# Patient Record
Sex: Female | Born: 1995 | Hispanic: Yes | Marital: Married | Smoking: Never smoker
Health system: Southern US, Community
[De-identification: ages and names within clinical notes are randomized; demographics above are authoritative.]

## PROBLEM LIST (undated history)

## (undated) HISTORY — PX: BREAST ENHANCEMENT SURGERY: SHX7

## (undated) HISTORY — PX: BREAST SURGERY: SHX581

## (undated) HISTORY — PX: EYE SURGERY: SHX253

---

## 2014-12-27 DIAGNOSIS — Z23 Encounter for immunization: Secondary | ICD-10-CM | POA: Insufficient documentation

## 2014-12-27 DIAGNOSIS — Y9389 Activity, other specified: Secondary | ICD-10-CM | POA: Insufficient documentation

## 2014-12-27 DIAGNOSIS — Y288XXA Contact with other sharp object, undetermined intent, initial encounter: Secondary | ICD-10-CM | POA: Insufficient documentation

## 2014-12-27 DIAGNOSIS — S0101XA Laceration without foreign body of scalp, initial encounter: Secondary | ICD-10-CM

## 2014-12-27 DIAGNOSIS — Y9289 Other specified places as the place of occurrence of the external cause: Secondary | ICD-10-CM | POA: Insufficient documentation

## 2014-12-27 DIAGNOSIS — Y998 Other external cause status: Secondary | ICD-10-CM | POA: Insufficient documentation

## 2014-12-27 NOTE — ED Provider Notes (Signed)
CSN: [id number]     Arrival date & time 12/27/14  1201 History  This chart was scribed for non-physician practitioner, Santiago Glad, PA-C, working with Nelva Nay, MD by Freida Busman, ED Scribe. This patient was seen in room WTR9/WTR9 and the patient's care was started at 12:41 PM.    Chief Complaint  Patient presents with  . Head Laceration   The history is provided by the patient. No language interpreter was used.    HPI Comments:  Leslie Fox is a 19 y.o. female who presents to the Emergency Department complaining of a laceration to the left side of her scalp after being struck  by a ceiling fan a few minutes PTA, with 9/10 surrounding pain. She states she was not paying attention when the fan struck her.  Fan did not fall on her head.  Her tetanus status is unknown . She denies LOC, nausea, vomiting and vision changes. No alleviating factors or associated symptoms noted.   History reviewed. No pertinent past medical history. Past Surgical History  Procedure Laterality Date  . Breast enhancement surgery     No family history on file. Social History  Substance Use Topics  . Smoking status: Never Smoker   . Smokeless tobacco: None  . Alcohol Use: No   OB History    No data available     Review of Systems  Eyes: Negative for visual disturbance.  Gastrointestinal: Negative for nausea and vomiting.  Skin: Positive for wound.  Neurological: Negative for syncope.     Allergies  Review of patient's allergies indicates no known allergies.  Home Medications   Prior to Admission medications   Not on File   BP 141/87 mmHg  Pulse 121  Temp(Src) 98.2 F (36.8 C) (Oral)  Resp 18  Ht  (1.549 m)  Wt 116 lb (52.617 kg)  BMI 21.93 kg/m2  SpO2 100%  LMP 12/27/2014 (Exact Date) Physical Exam  Constitutional: She is oriented to person, place, and time. She appears well-developed and well-nourished. No distress.  HENT:  Head: Normocephalic and atraumatic.   Eyes: Conjunctivae and EOM are normal. Pupils are equal, round, and reactive to light.  Neck: Normal range of motion. Neck supple.  Cardiovascular: Normal rate, regular rhythm and normal heart sounds.   Pulmonary/Chest: Effort normal and breath sounds normal. No respiratory distress.  Neurological: She is alert and oriented to person, place, and time. She has normal strength. No cranial nerve deficit. Gait normal.  Cranial Nerves intact    Skin: Skin is warm and dry.  4cm linear laceration of the left scalp    Psychiatric: She has a normal mood and affect.  Nursing note and vitals reviewed.   ED Course  Procedures   DIAGNOSTIC STUDIES:  Oxygen Saturation is 100% on RA, normal by my interpretation.    COORDINATION OF CARE:  12:46 PM Discussed treatment plan with pt at bedside and pt agreed to plan.  LACERATION REPAIR PROCEDURE NOTE The patient's identification was confirmed and consent was obtained. This procedure was performed by Santiago Glad, PA-C at 12:47 PM. Site: left scalp Sterile procedures observed Anesthetic used (type and amt): Lidocaine with epi 3 cc Suture type/size:  staple Length: 4 cm # of Staples: 5 Technique: staples Complexity: simple Site anesthetized, irrigated with NS, explored without evidence of foreign body, wound well approximated, site covered with dry, sterile dressing.  Patient tolerated procedure well without complications. Instructions for care discussed verbally and patient provided with additional written instructions for homecare  and f/u.   Labs Review Labs Reviewed - No data to display  Imaging Review No results found. I have personally reviewed and evaluated these images and lab results as part of my medical decision-making.   EKG Interpretation None      MDM   Final diagnoses:  None   Patient with a laceration of the scalp.  No LOC.  Normal neurological exam.  Tetanus updated in the ED.  Laceration repaired with  staples.  Patient stable for discharge.  Return precautions given.  I personally performed the services described in this documentation, which was scribed in my presence. The recorded information has been reviewed and is accurate.    Santiago Glad, PA-C 12/27/14 1454  Nelva Nay, MD 12/28/14 [phone]

## 2014-12-27 NOTE — ED Notes (Signed)
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 Pt has laceration to left side of head, no active bleeding.

## 2015-01-06 DIAGNOSIS — Z4802 Encounter for removal of sutures: Secondary | ICD-10-CM

## 2015-01-06 NOTE — ED Provider Notes (Signed)
CSN: [id number]     Arrival date & time 01/06/15  1153 History  By signing my name below, I, Leslie Fox, attest that this documentation has been prepared under the direction and in the presence of [employer], PA-C.  Electronically Signed: Ronney Fox, ED Scribe. 01/06/2015. 12:56 PM.    Chief Complaint  Patient presents with  . Suture / Staple Removal   The history is provided by the patient. No language interpreter was used.    HPI Comments: Leslie Fox is a 19 y.o. female who presents to the Emergency Department for removal of 5 staples placed to her head 10 days ago, after she had accidentally hit her head on the ceiling fan. She denies any problems since having them placed. She denies any drainage.  History reviewed. No pertinent past medical history. Past Surgical History  Procedure Laterality Date  . Breast enhancement surgery     Family History  Problem Relation Age of Onset  . Hypertension Mother    Social History  Substance Use Topics  . Smoking status: Never Smoker   . Smokeless tobacco: Never Used  . Alcohol Use: No   OB History    No data available     Review of Systems  Skin:       Negative for drainage from scalp area   Allergies  Review of patient's allergies indicates no known allergies.  Home Medications   Prior to Admission medications   Not on File   BP 114/59 mmHg  Pulse 71  Temp(Src) 97.7 F (36.5 C) (Oral)  Resp 14  SpO2 99%  LMP 12/27/2014 (Exact Date) Physical Exam  Constitutional: She is oriented to person, place, and time. She appears well-developed and well-nourished. No distress.  HENT:  Head: Normocephalic and atraumatic.  Eyes: Conjunctivae and EOM are normal.  Neck: Neck supple. No tracheal deviation present.  Cardiovascular: Normal rate.   Pulmonary/Chest: Effort normal. No respiratory distress.  Musculoskeletal: Normal range of motion.  Neurological: She is alert and oriented to person, place, and time.  Skin:  Skin is warm and dry.  She has 5 sutures in the front left scalp, without surrounding erythema or drainage. The wound appears to be healing well.   Psychiatric: She has a normal mood and affect. Her behavior is normal.  Nursing note and vitals reviewed.   ED Course  Procedures (including critical care time)  DIAGNOSTIC STUDIES: Oxygen Saturation is 100% on RA, normal by my interpretation.    COORDINATION OF CARE: 12:51 PM - Discussed treatment plan with pt at bedside which includes removal of staples. Pt verbalized understanding and agreed to plan.   STAPLE REMOVAL Performed by: Catha Gosselin, PA-C Authorized by: Pricilla Loveless, MD Consent: Verbal consent obtained. Consent given by: Patient Required items: required blood products, implants, devices, and special equipment available  Time out: Immediately prior to procedure a "time out" was called to verify the correct patient, procedure, equipment, support staff and site/side marked as required. Location: Right scalp Wound Appearance: Clean, well-healed  Staples Removed: 5 Post-removal: n/a Patient tolerance: Patient tolerated the procedure well with no immediate complications.    MDM   Final diagnoses:  Visit for suture removal  Staples removed. No signs of infection. I discussed return precautions as well as follow-up and she verbally agrees with the plan.  I personally performed the services described in this documentation, which was scribed in my presence. The recorded information has been reviewed and is accurate.    Catha Gosselin, PA-C  01/06/15 1833  Pricilla Loveless, MD 01/08/15 1510

## 2015-01-06 NOTE — Discharge Instructions (Signed)
Suture Removal, Care After Follow-up with a primary care provider using the resource guide below. Return for swelling or drainage from the staple site. Refer to this sheet in the next few weeks. These instructions provide you with information on caring for yourself after your procedure. Your health care provider may also give you more specific instructions. Your treatment has been planned according to current medical practices, but problems sometimes occur. Call your health care provider if you have any problems or questions after your procedure. WHAT TO EXPECT AFTER THE PROCEDURE After your stitches (sutures) are removed, it is typical to have the following:  Some discomfort and swelling in the wound area.  Slight redness in the area. HOME CARE INSTRUCTIONS   If you have skin adhesive strips over the wound area, do not take the strips off. They will fall off on their own in a few days. If the strips remain in place after 14 days, you may remove them.  Change any bandages (dressings) at least once a day or as directed by your health care provider. If the bandage sticks, soak it off with warm, soapy water.  Apply cream or ointment only as directed by your health care provider. If using cream or ointment, wash the area with soap and water 2 times a day to remove all the cream or ointment. Rinse off the soap and pat the area dry with a clean towel.  Keep the wound area dry and clean. If the bandage becomes wet or dirty, or if it develops a bad smell, change it as soon as possible.  Continue to protect the wound from injury.  Use sunscreen when out in the sun. New scars become sunburned easily. SEEK MEDICAL CARE IF:  You have increasing redness, swelling, or pain in the wound.  You see pus coming from the wound.  You have a fever.  You notice a bad smell coming from the wound or dressing.  Your wound breaks open (edges not staying together). Document Released: 12/21/2000 Document  Revised: 01/16/2013 Document Reviewed: 11/07/2012 [facility] Patient Information 2015 [place], [place]. This information is not intended to replace advice given to you by your health care provider. Make sure you discuss any questions you have with your health care provider.  Emergency Department Resource Guide 1) Find a Doctor and Pay Out of Pocket Although you won't have to find out who is covered by your insurance plan, it is a good idea to ask around and get recommendations. You will then need to call the office and see if the doctor you have chosen will accept you as a new patient and what types of options they offer for patients who are self-pay. Some doctors offer discounts or will set up payment plans for their patients who do not have insurance, but you will need to ask so you aren't surprised when you get to your appointment.  2) Contact Your Local Health Department Not all health departments have doctors that can see patients for sick visits, but many do, so it is worth a call to see if yours does. If you don't know where your local health department is, you can check in your phone book. The CDC also has a tool to help you locate your state's health department, and many state websites also have listings of all of their local health departments.  3) Find a Walk-in Clinic If your illness is not likely to be very severe or complicated, you may want to try a walk in clinic.  These are popping up all over the country in pharmacies, drugstores, and shopping centers. They're usually staffed by nurse practitioners or physician assistants that have been trained to treat common illnesses and complaints. They're usually fairly quick and inexpensive. However, if you have serious medical issues or chronic medical problems, these are probably not your best option. ° °No Primary Care Doctor: °- Call Health Connect at  832-8000 - they can help you locate a primary care doctor that  accepts your insurance, provides  certain services, etc. °- Physician Referral Service- 1-800-533-3463 ° °Chronic Pain Problems: °Organization         Address  Phone   Notes  °[facility]  (336) 297-2271 Patients need to be referred by their primary care doctor.  ° °Medication Assistance: °Organization         Address  Phone   Notes  °[place] [facility] [address] °[place], [place] [address] (336) 641-8030 --Must be a resident of [place] °-- Must have NO insurance coverage whatsoever (no Medicaid/ Medicare, etc.) °-- The pt. MUST have a primary care doctor that directs their care regularly and follows them in the community °  °[facility]  (866) 331-1348   °United Way  (888) 892-1162   ° °Agencies that provide inexpensive medical care: °Organization         Address  Phone   Notes  °[facility]  (336) 832-8035   °[facility]    (336) 832-7272   °[facility] [address] °[place], [place] [address] (336) 832-4777   °[facility] [address], °[place] (336) 271-4999   °[facility]    (336) 373-0678   °[facility]    (336) 272-1050   °[facility] ° [address], [place] Phone:  (336) 832-4444, Fax:  (336) 832-4440 Hours of Operation:  9 am - 6 pm, M-F.  Also accepts Medicaid/Medicare and self-pay.  °[facility] ° [address], [place] Phone: (336) 832-3150, Fax: (336) [phone]. Hours of Operation:  8:30 am - 5:30 pm, M-F.  Also accepts Medicaid and self-pay.  °[facility] [place] [address], [place] Phone: (336) 878-6027   °[facility] [address], [place], [place] (336)723-1848, Ext. 123 Mondays & Thursdays: 7-9 AM.  First 15 patients are seen on a first come, first serve basis. °  ° °Medicaid-accepting [place] Providers: ° °Organization         Address  Phone   Notes  °[facility] [address], [place] (336) 641-2100 Also accepts self-pay patients.  °[facility] [address], [place] ° (336) 856-9996   °[facility] [address], [place] (336) 288-8857   °[facility] [address] [place] [address], [place] (336) 299-7000   °[facility] [address], [place]  ° (336) [phone] Only accepts [place] Access Medicaid patients after they have their name applied to their card.  ° °Self-Pay (no insurance) in [place]: ° °Organization         Address  Phone   Notes  °Sickle Cell Patients, [facility] [address], [place] (336) 832-1970   °[facility] [address], [place] (336) [phone]   °[facility]   [place]  [address], [place] [phone]   [facility]/Dr. Osei-Bonsu  [address], [place] or [address], [place] [phone] Phone number for both [place] and [place] locations is the same.  [facility] [address] [place] [phone]   [facility] [address] [place] or [address] [phone] (334)[phone]   [facility] [address], [place] (249) 651-8058, phone; ([phone]) [phone], fax Sees patients 1st and 3rd Saturday of every month.  Must not qualify for public or private insurance (i.e. Medicaid, Medicare, [place] Health Choice, Veterans' Benefits)  Household income should be no more than 200% of the poverty level The clinic cannot treat you if you are pregnant or think you are pregnant  Sexually transmitted diseases are not treated at the clinic.    Dental Care: Organization         Address  Phone  Notes  [facility] [place] [phone]  Accepts children up to age 41 who are enrolled in [place] or [place]; pregnant women with a Medicaid card; and children who have applied for Medicaid or [place] Health Choice, but were declined, whose parents can pay a reduced fee at time of service.  [facility]  [address], [place] [phone] Accepts children up to age 64 who are enrolled in [place] or [place]; pregnant women with a Medicaid card; and children who have applied for Medicaid or  Health Choice, but were declined, whose parents can pay a reduced fee at time of service.  [facility]  [place] (830)561-7340 Patients are seen by appointment only. Walk-ins are not accepted. [place] will see patients 2 years of age and older. Monday - Tuesday (8am-5pm) Most Wednesdays (8:30-5pm) $30 per visit, cash only  [facility] Adult [facility] Access PRO[facility]  [address], [facility] [phone] Patients are seen by appointment only. Walk-ins are not accepted. [place] will see patients 42 years of age and older. One Wednesday Evening (Monthly: Volunteer Based).  $30 per visit, cash only  [place]  (820) [phone] for adults; Children under age 31, call Graduate Pediatric Dentistry at [phone]. Children aged 43-14, please call [phone] to request a pediatric application.  Dental services are provided in all areas of dental care including fillings, crowns and bridges, complete and partial dentures, implants, gum treatment, root canals, and extractions. Preventive care is also provided. Treatment is provided to both adults and children. Patients are selected via a lottery and there is often a waiting list.   [facility] [address] [place]  [phone] [email or web address]   [facility] [address] [place], [place] [phone], Ext. 123 Second  and Fourth Thursday of each month, opens at 6:30 AM; Clinic ends at 9 AM.  Patients are seen on a first-come first-served basis, and a limited number are seen during each clinic.   [facility]  [address] [place], [place] [phone]   Eligibility Requirements You must have lived in [place], [place], or [place] counties for at least the last three months.   You cannot be eligible for state or federal sponsored [employer], including [employer]  Administration, Medicaid, or Medicare.   You generally cannot be eligible for healthcare insurance through your employer.    How to apply: Eligibility screenings are held every Tuesday and Wednesday afternoon from 1:00 pm until 4:00 pm. You do not need an appointment for the interview!  [facility] [address] [place], [place]   [place]  [place] Department  [place]  [phone]    Behavioral Health Resources in the Community: Intensive Outpatient Programs Organization         Address  Phone  Notes  [facility] [place]. [address] [place], [place] [phone]   [facility] [address], [place], [place]   [facility] [address] [place], [place]   [place] [address]  [place], [place] or (423)837-2461   Substance Abuse Resources Organization         Address  Phone  Notes  [facility] Drug [facility]  330-089-0459   [place]  867-658-3742   [facility]   Chinita Pester  (660)573-6866   Residential & Outpatient Substance Abuse Program  661-842-3814   Psychological Services Organization         Address  Phone  Notes  [facility] [place]  [place]  657-264-1585   [place] [address], [place] or (701)289-1235    Mobile Crisis Teams Organization         Address  Phone  Notes  [facility], Mobile Crisis Care Unit  (702) 590-1465   [facility]  [address] [place], [place]   [place] [address], [place] (603)118-7567    Self-Help/Support Groups Organization         Address  Phone             Notes  [place]. [facility] [place] - variety of support groups  [place] Call for more information  Narcotics Anonymous (NA), [facility] [address], [employer] [place]  2 meetings at this location   Special educational needs teacher         Address  Phone  Notes  [facility] [place],    [place]  1-(850) 696-6471   [facility]  [address], [place] [phone], [place], [place]   [place], [place] [phone] Admissions: 8am-3pm M-F  Incentives Substance [place] [address],    [place], [place] [phone]   [facility] [address] [place], [place], [place]   [facility] [address]  [place], [place]   [facility] - Intensive Outpatient [address], [place], [place]   [facility] ([place].,  [place], [place] [phone] or ([phone]   [facility] ([facility]) [address], [place], [place] Accepts Medicaid  [facility] [address]  [place] [phone] Substance Abuse/Addiction Treatment   [facility] Organization         Address  Phone  Notes  [facility]  (646)804-1827   Domenic Schwab, PhD [address] [place] [address] [place], [place]   [phone] or (217)[phone]  [facility]   [address] °[place], [place]  (336) 349-4454   °[facility] [address], [place], [place] (336) [phone] Insurance/Medicaid/sponsorship through [facility]  °[facility] [address]                                    [place], [place] (336) [phone] Therapy/tele-psych/case  °[facility] [address]  ° [place], [place] (336) [phone]    °Dr. Arfeen  (336) [phone]   °[facility] [place]  [employer] [place] [facility] 1) [address],  °2) [address], [place] °3)  [address], [place] (336) 349-3220 °(336) [phone] ° °(336) 342-8140   °[place] Child Abuse Hotline (336) 342-1394 or (336) 342-3537 (After Hours)    ° ° ° °

## 2015-01-06 NOTE — ED Notes (Signed)
Patient states she had staples placed 10 days in her head and was told to have them removed in 10- 14 days. No signs of infection.

## 2017-04-25 VITALS — BP 100/58 | HR 74 | Temp 98.8°F | Resp 16 | Ht 61.0 in | Wt 112.0 lb

## 2017-04-25 DIAGNOSIS — Z113 Encounter for screening for infections with a predominantly sexual mode of transmission: Secondary | ICD-10-CM

## 2017-04-25 DIAGNOSIS — B9689 Other specified bacterial agents as the cause of diseases classified elsewhere: Secondary | ICD-10-CM

## 2017-04-25 DIAGNOSIS — N898 Other specified noninflammatory disorders of vagina: Secondary | ICD-10-CM

## 2017-04-25 DIAGNOSIS — N760 Acute vaginitis: Secondary | ICD-10-CM

## 2017-05-01 NOTE — Progress Notes (Signed)
Please call pt and let her know she is negative for gonorrhea, chlamydia and trichomonas.

## 2017-05-08 VITALS — BP 108/62 | HR 98 | Temp 100.0°F | Resp 16 | Ht 61.0 in | Wt 107.8 lb

## 2017-05-08 DIAGNOSIS — S01511A Laceration without foreign body of lip, initial encounter: Secondary | ICD-10-CM

## 2017-05-08 DIAGNOSIS — W19XXXA Unspecified fall, initial encounter: Secondary | ICD-10-CM

## 2017-05-08 DIAGNOSIS — S025XXA Fracture of tooth (traumatic), initial encounter for closed fracture: Secondary | ICD-10-CM

## 2017-05-08 NOTE — Patient Instructions (Addendum)
Please apply ice to your lip.  I would recommend that she try to find a dentist today to ensure that you do not have a bad outcome with regard to your teeth.  The laceration on your lip appears to be healing normally so please apply the ointment that I prescribed.  You can also place this on your nose.  Take naproxen 500 mg every 12 hours for pain.  If you have any new symptoms or failure that you are not getting better I would like to see you back in the clinic in a few days.   IF you received an x-ray today, you will receive an invoice from [facility]. Please contact [facility] at [phone] with questions or concerns regarding your invoice.   IF you received labwork today, you will receive an invoice from [facility]. Please contact [facility] at [phone] with questions or concerns regarding your invoice.   Our billing staff will not be able to assist you with questions regarding bills from these companies.  You will be contacted with the lab results as soon as they are available. The fastest way to get your results is to activate your My Chart account. Instructions are located on the last page of this paperwork. If you have not heard from us regarding the results in 2 weeks, please contact this office.

## 2017-05-08 NOTE — Progress Notes (Signed)
05/08/2017 4:21 PM   DOB: 22-Aug-1995 / MRN: [id number]  SUBJECTIVE:   Leslie Fox is a 22 y.o. female presenting for lip laceration and nose abrasion that occurred more than 48 hours ago.  States that she was drunk Saturday night fell and hit her face on some concrete.  She chipped a tooth, cut her lip on her teeth, and scraped her nose.  She feels like the pain in her lip is getting worse and that is why she is here today.  She is tolerating p.o.  She denies rhinorrhea, changes in hearing, headache, neck pain.  She tells me that she feels safe at home and that today's wounds are not the result of abuse.  She has No Known Allergies.   She  has no past medical history on file.    She  reports that  has never smoked. she has never used smokeless tobacco. She reports that she does not drink alcohol or use drugs. She  has no sexual activity history on file. The patient  has a past surgical history that includes Breast enhancement surgery.  Her family history includes Hypertension in her mother.  Review of Systems  Constitutional: Negative for chills, diaphoresis and fever.  Eyes: Negative.   Respiratory: Negative for cough, hemoptysis, sputum production, shortness of breath and wheezing.   Gastrointestinal: Negative for nausea.  Skin: Negative for rash.  Neurological: Negative for dizziness, sensory change, speech change, focal weakness and headaches.    The problem list and medications were reviewed and updated by myself where necessary and exist elsewhere in the encounter.   OBJECTIVE:  BP 108/62 (BP Location: Left Arm, Patient Position: Sitting, Cuff Size: Normal)   Pulse 98   Temp 100 F (37.8 C) (Oral)   Resp 16   Ht 5\' 1"  (1.549 m)   Wt 107 lb 12.8 oz (48.9 kg)   LMP 05/01/2017   SpO2 98%   BMI 20.37 kg/m   Wt Readings from Last 3 Encounters:  05/08/17 107 lb 12.8 oz (48.9 kg)  04/25/17 112 lb (50.8 kg)  12/27/14 116 lb (52.6 kg)   Temp Readings from Last 3  Encounters:  05/08/17 100 F (37.8 C) (Oral)  04/25/17 98.8 F (37.1 C)  01/06/15 97.7 F (36.5 C) (Oral)   BP Readings from Last 3 Encounters:  05/08/17 108/62  04/25/17 (!) 100/58  01/06/15 114/59   Pulse Readings from Last 3 Encounters:  05/08/17 98  04/25/17 74  01/06/15 71     Physical Exam  Constitutional: She is active.  Non-toxic appearance.  HENT:  Head: Head is without raccoon's eyes, without Battle's sign, without right periorbital erythema and without left periorbital erythema.  Right Ear: No hemotympanum.  Left Ear: No hemotympanum.  Mouth/Throat:    Cardiovascular: Normal rate, regular rhythm, S1 normal, S2 normal, normal heart sounds and intact distal pulses. Exam reveals no gallop, no friction rub and no decreased pulses.  No murmur heard. Pulmonary/Chest: Effort normal. No stridor. No tachypnea. No respiratory distress. She has no wheezes. She has no rales.  Abdominal: She exhibits no distension.  Musculoskeletal: She exhibits no edema.  Neurological: She is alert.  Skin: Skin is warm and dry. She is not diaphoretic. No pallor.    No results found for this or any previous visit (from the past 72 hour(s)).  No results found.  ASSESSMENT AND PLAN:  Noelene was seen today for fall.  Diagnoses and all orders for this visit:  Fall, initial encounter  Lip laceration, initial encounter: This will heal.  I will be happy to see her back though I do not think it is necessary.  She needs pain control and to prevent a secondary bacterial superinfection.  I advised that she try to seek the care of a dentist as quickly as possible.  I do believe that she does feel safe at home and her partner who is with her today does seem to care about her welfare. -     mupirocin ointment (BACTROBAN) 2 %; Apply 1 application topically 2 (two) times daily. -     naproxen (NAPROSYN) 500 MG tablet; Take 1 tablet (500 mg total) by mouth 2 (two) times daily with a  meal.  Closed fracture of tooth, initial encounter    The patient is advised to call or return to clinic if she does not see an improvement in symptoms, or to seek the care of the closest emergency department if she worsens with the above plan.   Deliah BostonMichael Daniesha Driver, MHS, PA-C [facility] 05/08/2017 4:21 PM

## 2017-05-09 DIAGNOSIS — B9789 Other viral agents as the cause of diseases classified elsewhere: Secondary | ICD-10-CM | POA: Insufficient documentation

## 2017-05-09 DIAGNOSIS — R079 Chest pain, unspecified: Secondary | ICD-10-CM

## 2017-05-09 DIAGNOSIS — J069 Acute upper respiratory infection, unspecified: Secondary | ICD-10-CM

## 2017-05-09 DIAGNOSIS — Z79899 Other long term (current) drug therapy: Secondary | ICD-10-CM | POA: Insufficient documentation

## 2017-05-09 NOTE — Discharge Instructions (Signed)
Please read and follow all provided instructions.  Your diagnoses today include:  1. Viral URI with cough   2. Central chest pain     You appear to have an upper respiratory infection (URI). An upper respiratory tract infection, or cold, is a viral infection of the air passages leading to the lungs. It should improve gradually after 5-7 days. You may have a lingering cough that lasts for 2- 4 weeks after the infection.  Coughing can induce pain in the musculature of the chest.  Fortunately your workup is reassuring for other causes of chest pain.  Tests performed today include: Vital signs. See below for your results today.   Medications prescribed:   Take any prescribed medications only as directed. Treatment for your infection is aimed at treating the symptoms. There are no medications, such as antibiotics, that will cure your infection.   You are prescribed ibuprofen, a non-steroidal anti-inflammatory agent (NSAID) for pain. You may take 600mg  every 6 hours as needed for pain. If still requiring this medication around the clock for acute pain after 10 days, please see your primary healthcare provider.  Do not combine ibuprofen with naproxen.  Women who are pregnant, breastfeeding, or planning on becoming pregnant should not take non-steroidal anti-inflammatories such as   You may combine this medication with Tylenol, 650 mg every 6 hours, so you are receiving something for pain every 3 hours.  This is not a long-term medication unless under the care and direction of your primary provider. Taking this medication long-term and not under the supervision of a healthcare provider could increase the risk of stomach ulcers, kidney problems, and cardiovascular problems such as high blood pressure.   Home care instructions:  Follow any educational materials contained in this packet.   Your illness is contagious and can be spread to others, especially during the first 3 or 4 days. It cannot  be cured by antibiotics or other medicines. Take basic precautions such as washing your hands often, covering your mouth when you cough or sneeze, and avoiding public places where you could spread your illness to others.   Please continue drinking plenty of fluids.  Use over-the-counter medicines as needed as directed on packaging for symptom relief.  You may also use ibuprofen or tylenol as directed on packaging for pain or fever.  Do not take multiple medicines containing Tylenol or acetaminophen to avoid taking too much of this medication.  Follow-up instructions: Please follow-up with your primary care provider in the next 3 days for further evaluation of your symptoms if you are not feeling better.   Return instructions:  Please return to the Emergency Department if you experience worsening symptoms.  RETURN IMMEDIATELY IF you develop increased shortness of breath, confusion or altered mental status, a new rash, become dizzy, faint, or poorly responsive, or are unable to be cared for at home. Please return if you have persistent vomiting and cannot keep down fluids or develop a fever that is not controlled by tylenol or motrin.   Please return if you have any other emergent concerns.  Additional Information:  Your vital signs today were: BP 124/79 (BP Location: Left Arm)    Pulse (!) 101    Temp (!) 100.5 F (38.1 C) (Oral)    Resp 20    Ht 5\' 1"  (1.549 m)    Wt 48.5 kg (107 lb)    LMP 05/01/2017    SpO2 97%    BMI 20.22 kg/m  If your blood  pressure (BP) was elevated above 135/85 this visit, please have this repeated by your doctor within one month. --------------

## 2019-02-18 IMAGING — CR DG CHEST 2V
2 series · 2 of 2 positions shown · non-contrast
Comparison: None.

CLINICAL DATA: Non productive cough x 3 days; no known
cardiopulmonary problems; non smoker

EXAM:
CHEST  2 VIEW

[w chest pa]
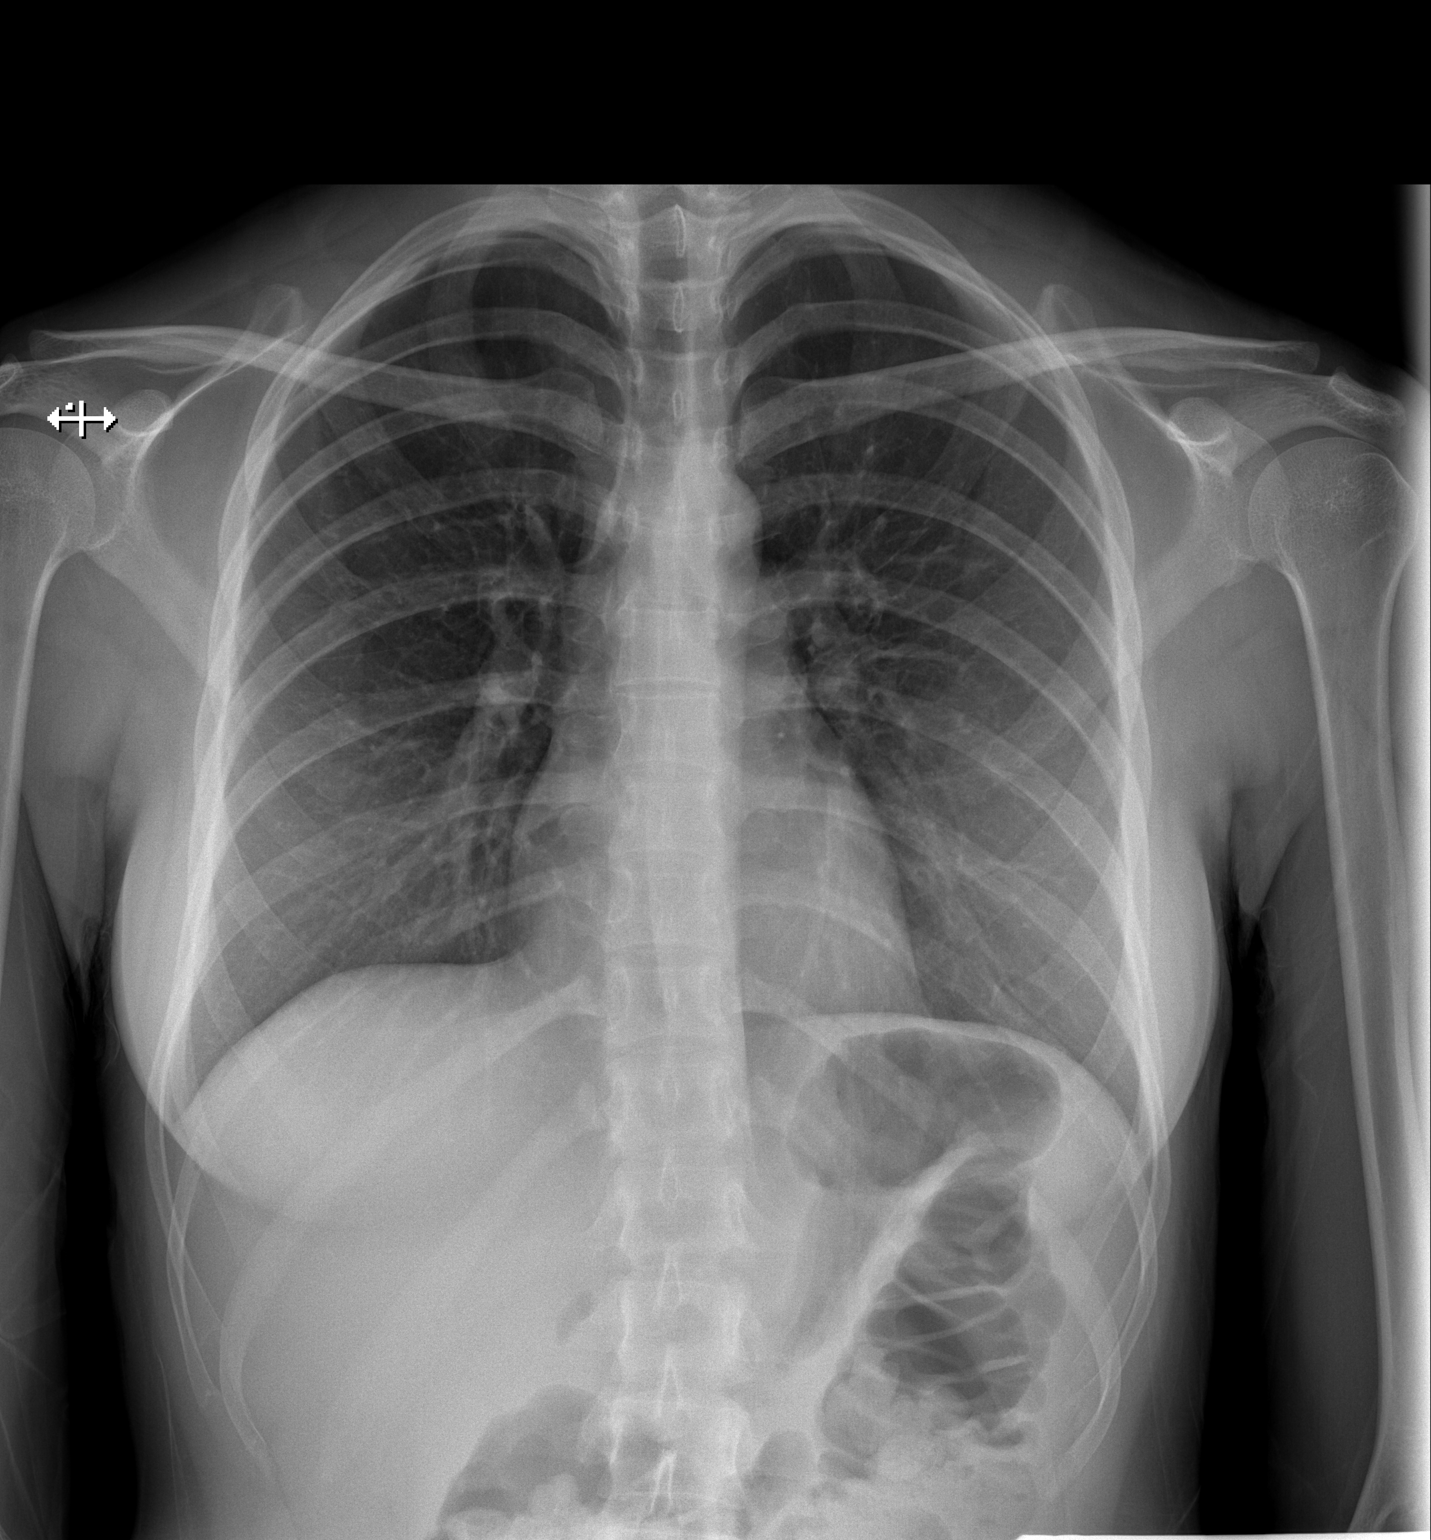

[w chest lat]
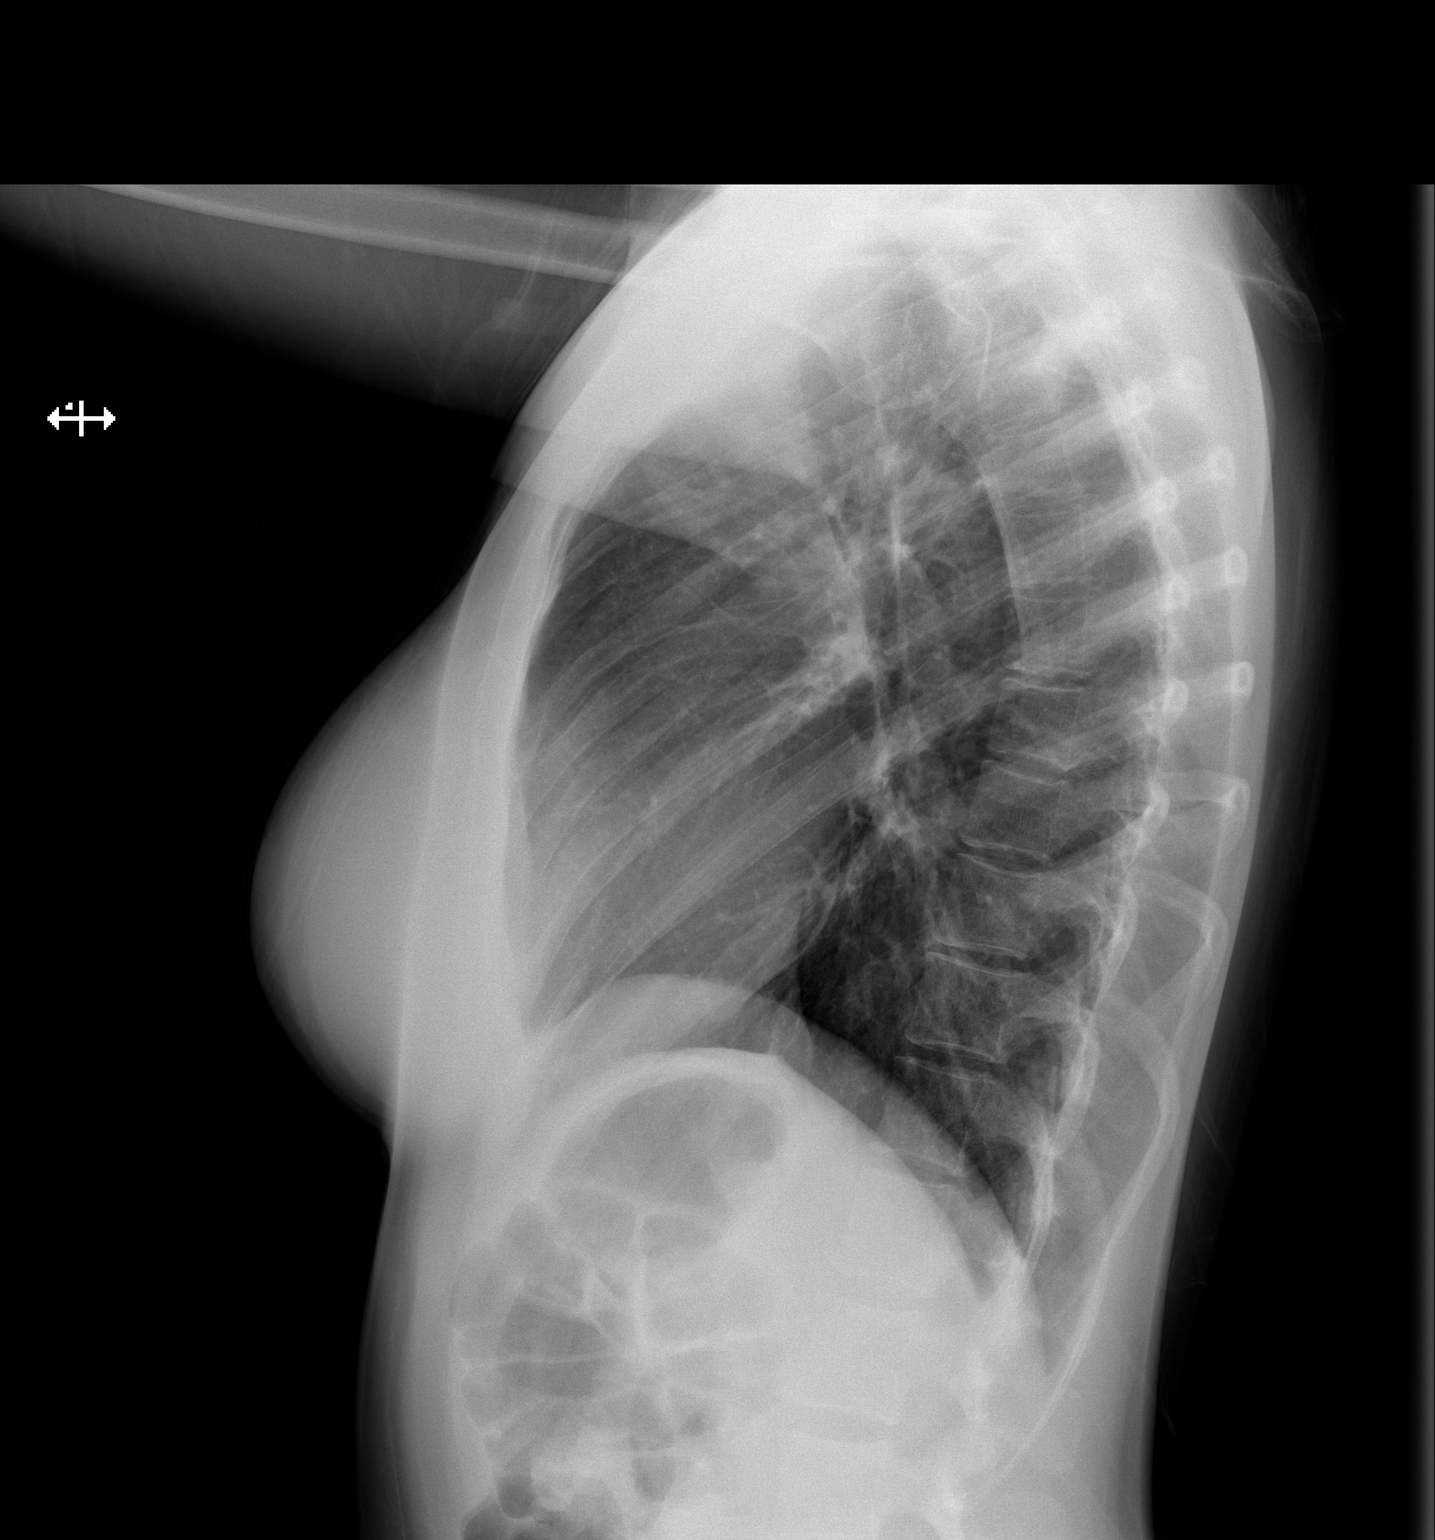

[2 of 2 positions shown; findings below may reference images not displayed]

FINDINGS: The heart size and mediastinal contours are within normal limits.
Both lungs are clear. The visualized skeletal structures are
unremarkable.
IMPRESSION: No active cardiopulmonary disease.  No evidence of pneumonia.

## 2019-07-25 DIAGNOSIS — Z23 Encounter for immunization: Secondary | ICD-10-CM

## 2019-07-25 NOTE — Progress Notes (Signed)
   Covid-19 Vaccination Clinic  Name:  Leslie Fox    MRN: [id number] DOB: Oct 05, 1995  07/25/2019  [name] was observed post Covid-19 immunization for 15 minutes without incident. She was provided with Vaccine Information Sheet and instruction to access the V-Safe system.   [name] was instructed to call 911 with any severe reactions post vaccine: Marland Kitchen Difficulty breathing  . Swelling of face and throat  . A fast heartbeat  . A bad rash all over body  . Dizziness and weakness   Immunizations Administered    Name Date Dose VIS Date Route   Pfizer COVID-19 Vaccine 07/25/2019 11:30 AM 0.3 mL 03/22/2019 Intramuscular   Manufacturer: [employer], [employer]   Lot: [id number]   NDC: [address]-0

## 2019-08-19 DIAGNOSIS — Z23 Encounter for immunization: Secondary | ICD-10-CM

## 2019-08-19 NOTE — Progress Notes (Signed)
   Covid-19 Vaccination Clinic  Name:  Hilma Steinhilber    MRN: [id number] DOB: 1995/12/02  08/19/2019  Ms. Raulerson was observed post Covid-19 immunization for 15 minutes without incident. She was provided with Vaccine Information Sheet and instruction to access the V-Safe system.   Ms. Mendell was instructed to call 911 with any severe reactions post vaccine: Marland Kitchen Difficulty breathing  . Swelling of face and throat  . A fast heartbeat  . A bad rash all over body  . Dizziness and weakness   Immunizations Administered    Name Date Dose VIS Date Route   Pfizer COVID-19 Vaccine 08/19/2019 10:31 AM 0.3 mL 06/05/2018 Intramuscular   Manufacturer: [employer], [employer]   Lot: [id number]   NDC: [address]-1

## 2020-09-30 DIAGNOSIS — R0789 Other chest pain: Secondary | ICD-10-CM | POA: Insufficient documentation

## 2020-09-30 DIAGNOSIS — R079 Chest pain, unspecified: Secondary | ICD-10-CM

## 2020-09-30 DIAGNOSIS — E876 Hypokalemia: Secondary | ICD-10-CM | POA: Insufficient documentation

## 2020-09-30 DIAGNOSIS — R0600 Dyspnea, unspecified: Secondary | ICD-10-CM | POA: Insufficient documentation

## 2020-09-30 NOTE — ED Triage Notes (Signed)
Pt states that she has had chest pain and shortness of breath that began around 1 hour ago. Pt has had hx of chest pain and SOB for the past year. Pt denies hx of asthma or smoking. Pt states she was seen three days ago for chest pain at [facility]. Pt has hx of breast implants that she got about 6 years ago.

## 2020-09-30 NOTE — ED Provider Notes (Signed)
Emergency Medicine Provider Triage Evaluation Note  Leslie Fox 25 y.o. female was evaluated in triage.  Pt complains of chest pain and SOB that started about an hour ago.  She reports she has had intermittent symptoms over the last 3 to 4 days.  She saw urgent care and reports she had a recent unremarkable work-up.  She states that at home, she has had episodes where she feels like she cannot catch her breath.  Tonight's episode was worse.  She reports she has had some tingling sensation in her bilateral hands.  No recent fevers, chills.  No abdominal pain, nausea/vomiting.  She went to [place] for Memorial Day weekend.  No other travel. She denies any OCP use, recent immobilization, prior history of DVT/PE, recent surgery, leg swelling.     Review of Systems  Positive: CP, SOB Negative: Fevers, n/v   Physical Exam  BP 134/82   Pulse 70   Temp 98.2 F (36.8 C) (Oral)   Resp 18   Ht 5\' 4"  (1.626 m)   Wt 65.8 kg   SpO2 100%   BMI 24.89 kg/m  Gen:   Awake, no distress. Anxious  HEENT:  Atraumatic  Resp:  Slight IWOB CTAB. No wheezing.  Cardiac:  Normal rate  Abd:   Nondistended, nontender  MSK:   Moves extremities without difficulty  Neuro:  Speech clear   Other:      Medical Decision Making  Medically screening exam initiated at 8:01 PM  Appropriate orders placed.  Leslie Fox was informed that the remainder of the evaluation will be completed by another provider, this initial triage assessment does not replace that evaluation. They are counseled that they will need to remain in the ED until the completion of their workup, including full H&P and results of any tests.  Risks of leaving the emergency department prior to completion of treatment were discussed. Patient was advised to inform ED staff if they are leaving before their treatment is complete. The patient acknowledged these risks and time was allowed for questions.     The patient appears stable so that the remainder  of the MSE may be completed by another provider.    Clinical Impression  CP, SOB   Portions of this note were generated with Dragon dictation software. Dictation errors may occur despite best attempts at proofreading.     Leslie Fox 09/30/20 2003    10/02/20, MD 09/30/20 2226

## 2020-09-30 NOTE — ED Notes (Signed)
Warm blanket provided, per pt request.

## 2020-09-30 NOTE — ED Provider Notes (Signed)
[facility]-EMERGENCY DEPT Provider Note   CSN: [id number] Arrival date & time: 09/30/20  1943     History Chief Complaint  Patient presents with   Chest Pain    Leslie Fox is a 25 y.o. female who presents to the ED with complaints of dyspnea/chest pain that began yesterday. Patient reports dyspnea with chest pain described as a pressure, worse with a deep breath and stress, no alleviating factors. At times gets tingling to her fingers with this. Has had increased stress recently. Intermittent dyspnea over past year, has never lasted this long. Denies leg pain/swelling, hemoptysis, recent surgery/trauma, recent long travel, hormone use, personal hx of cancer, or hx of DVT/PE. Flew to [place] recently, short flight.   HPI     History reviewed. No pertinent past medical history.  There are no problems to display for this patient.   Past Surgical History:  Procedure Laterality Date   BREAST ENHANCEMENT SURGERY       OB History   No obstetric history on file.     Family History  Problem Relation Age of Onset   Hypertension Mother     Social History   Tobacco Use   Smoking status: Never   Smokeless tobacco: Never  Substance Use Topics   Alcohol use: No   Drug use: No    Home Medications Prior to Admission medications   Medication Sig Start Date End Date Taking? Authorizing Provider  ibuprofen (ADVIL,MOTRIN) 600 MG tablet Take 1 tablet (600 mg total) by mouth every 6 (six) hours as needed. 05/09/17   Aviva Kluver B, PA-C  metroNIDAZOLE (FLAGYL) 500 MG tablet Take 1 tablet (500 mg total) by mouth 2 (two) times daily with a meal. DO NOT CONSUME ALCOHOL WHILE TAKING THIS MEDICATION. Patient not taking: Reported on 05/08/2017 04/25/17   McVey, Madelaine Bhat, PA-C  mupirocin ointment (BACTROBAN) 2 % Apply 1 application topically 2 (two) times daily. 05/08/17   Ofilia Neas, PA-C  naproxen (NAPROSYN) 500 MG tablet Take 1 tablet (500 mg  total) by mouth 2 (two) times daily with a meal. 05/08/17   Ofilia Neas, PA-C    Allergies    Patient has no known allergies.  Review of Systems   Review of Systems  Constitutional:  Negative for chills, diaphoresis and fever.  Respiratory:  Positive for shortness of breath. Negative for cough.   Cardiovascular:  Positive for chest pain. Negative for leg swelling.  Gastrointestinal:  Negative for abdominal pain, diarrhea, nausea and vomiting.  Neurological:  Negative for syncope.  All other systems reviewed and are negative.  Physical Exam Updated Vital Signs BP 104/77   Pulse (!) 101   Temp 98.1 F (36.7 C) (Oral)   Resp 18   Ht 5\' 1"  (1.549 m)   Wt 50 kg   SpO2 97%   BMI 20.83 kg/m   Physical Exam Vitals and nursing note reviewed.  Constitutional:      General: She is not in acute distress.    Appearance: She is well-developed. She is not toxic-appearing.  HENT:     Head: Normocephalic and atraumatic.  Eyes:     General:        Right eye: No discharge.        Left eye: No discharge.     Conjunctiva/sclera: Conjunctivae normal.  Cardiovascular:     Rate and Rhythm: Normal rate and regular rhythm.     Pulses:          Radial  pulses are 2+ on the right side and 2+ on the left side.       Dorsalis pedis pulses are 2+ on the right side and 2+ on the left side.  Pulmonary:     Effort: Pulmonary effort is normal. No respiratory distress.     Breath sounds: Normal breath sounds. No wheezing, rhonchi or rales.  Abdominal:     General: There is no distension.     Palpations: Abdomen is soft.     Tenderness: There is no abdominal tenderness.  Musculoskeletal:     Cervical back: Neck supple.     Right lower leg: No tenderness. No edema.     Left lower leg: No tenderness. No edema.  Skin:    General: Skin is warm and dry.     Capillary Refill: Capillary refill takes less than 2 seconds.     Findings: No rash.  Neurological:     General: No focal deficit present.      Mental Status: She is alert.     Comments: Clear speech.   Psychiatric:        Behavior: Behavior normal.    ED Results / Procedures / Treatments   Labs (all labs ordered are listed, but only abnormal results are displayed) Labs Reviewed  BASIC METABOLIC PANEL - Abnormal; Notable for the following components:      Result Value   Potassium 3.1 (*)    All other components within normal limits  CBC  D-DIMER, QUANTITATIVE  I-STAT BETA HCG BLOOD, ED ([facility], [facility], [facility] ONLY)  TROPONIN I (HIGH SENSITIVITY)  TROPONIN I (HIGH SENSITIVITY)    EKG None  Radiology DG Chest 2 View  Result Date: 09/30/2020 CLINICAL DATA:  25 year old female with chest pain. EXAM: CHEST - 2 VIEW COMPARISON:  Chest radiograph dated 05/09/2017. FINDINGS: The heart size and mediastinal contours are within normal limits. Both lungs are clear. The visualized skeletal structures are unremarkable. IMPRESSION: No active cardiopulmonary disease. Electronically Signed   By: Elgie Collard M.D.   On: 09/30/2020 20:17    Procedures Procedures   Medications Ordered in ED Medications - No data to display  ED Course  I have reviewed the triage vital signs and the nursing notes.  Pertinent labs & imaging results that were available during my care of the patient were reviewed by me and considered in my medical decision making (see chart for details).    MDM Rules/Calculators/A&P                         Patient presents to the ED with complaints of chest pain/dyspnea.  Nontoxic, vitals without significant abnormality.  Exam fairly benign.   Additional history obtained:  Additional history obtained from chart review & nursing note review.   EKG: Sinus rhythm, no STEMI.   Lab Tests:  I reviewed and interpreted labs, which included:  CBC: Unremarkable.  BMP: Mild hypokalemia.  Troponins: Flat  Preg test: Negative D-dimer: WNL.   Imaging Studies ordered:  CXR ordered by triage, I independently reviewed,  formal radiology impression shows: No active cardiopulmonary disease.  ED Course:   Heart Pathway Score low risk- EKG without obvious acute ischemia, delta troponin negative, doubt ACS. Patient is low risk wells, d-dimer negative, doubt pulmonary embolism. Pain is not a tearing sensation, symmetric pulses, no widening of mediastinum on CXR, doubt dissection. Cardiac monitor reviewed, no notable arrhythmias or tachycardia. Unclear definitive etiology, with increased stress will trial PRN atarax, PCP  follow up. I discussed results, treatment plan, need for follow-up, and return precautions with the patient. Provided opportunity for questions, patient confirmed understanding and is in agreement with plan.    Portions of this note were generated with Scientist, clinical (histocompatibility and immunogenetics). Dictation errors may occur despite best attempts at proofreading.  Final Clinical Impression(s) / ED Diagnoses Final diagnoses:  Chest pain, unspecified type    Rx / DC Orders ED Discharge Orders          Ordered    hydrOXYzine (ATARAX/VISTARIL) 10 MG tablet  3 times daily PRN        10/01/20 0031             Cherly Anderson, PA-C 10/01/20 0237    Paula Libra, MD 10/01/20 [phone]

## 2020-10-01 NOTE — Discharge Instructions (Addendum)
You were seen in the emergency department today for chest pain. Your work-up in the emergency department has been overall reassuring. Your labs have been fairly normal and or similar to previous blood work you have had done, your potassium was a bit low please include potassium rich foods in your diet. Your EKG and the enzyme we use to check your heart did not show an acute heart attack at this time. Your chest x-ray was normal. Your lab test to check for a blood clot was negative.   We are sending you home with atarax to take every 8 hours as needed for stress to see if this helps with your additional symptoms as well.   We have prescribed you new medication(s) today. Discuss the medications prescribed today with your pharmacist as they can have adverse effects and interactions with your other medicines including over the counter and prescribed medications. Seek medical evaluation if you start to experience new or abnormal symptoms after taking one of these medicines, seek care immediately if you start to experience difficulty breathing, feeling of your throat closing, facial swelling, or rash as these could be indications of a more serious allergic reaction  We would like you to follow up closely with your primary care provider  within 1-3 days. Return to the ER immediately should you experience any new or worsening symptoms including but not limited to return of pain, worsened pain, vomiting, shortness of breath, dizziness, lightheadedness, passing out, or any other concerns that you may have.

## 2020-10-06 DIAGNOSIS — Z20822 Contact with and (suspected) exposure to covid-19: Secondary | ICD-10-CM | POA: Insufficient documentation

## 2020-10-06 DIAGNOSIS — R0981 Nasal congestion: Secondary | ICD-10-CM

## 2020-10-06 DIAGNOSIS — J029 Acute pharyngitis, unspecified: Secondary | ICD-10-CM

## 2020-10-06 DIAGNOSIS — R0602 Shortness of breath: Secondary | ICD-10-CM | POA: Insufficient documentation

## 2020-10-06 NOTE — ED Triage Notes (Signed)
Patient arrived stating she has a sore throat, congestion, and chest tightness. States she was seen last week for same and told it was anxiety. Reports symptoms worsen when she is laying down.

## 2020-10-06 NOTE — ED Provider Notes (Signed)
[facility]-EMERGENCY DEPT Provider Note   CSN: [id number] Arrival date & time: 10/06/20  1847     History Chief Complaint  Patient presents with   Sore Throat   Shortness of Breath   Nasal Congestion    Leslie Fox is a 25 y.o. female.  Here for nasal congestion, sore throat.  Has been on some allergy medication with minimal relief.  Had evaluation in the ED last week.  The history is provided by the patient.  Sore Throat This is a new problem. The current episode started more than 1 week ago. The problem occurs constantly. The problem has not changed since onset.Associated symptoms include shortness of breath. Pertinent negatives include no chest pain and no abdominal pain. Nothing aggravates the symptoms. Nothing relieves the symptoms. She has tried nothing for the symptoms. The treatment provided no relief.  Shortness of Breath Associated symptoms: sore throat   Associated symptoms: no abdominal pain, no chest pain, no cough, no ear pain, no fever, no rash and no vomiting       History reviewed. No pertinent past medical history.  There are no problems to display for this patient.   Past Surgical History:  Procedure Laterality Date   BREAST ENHANCEMENT SURGERY       OB History   No obstetric history on file.     Family History  Problem Relation Age of Onset   Hypertension Mother     Social History   Tobacco Use   Smoking status: Never   Smokeless tobacco: Never  Substance Use Topics   Alcohol use: No   Drug use: No    Home Medications Prior to Admission medications   Medication Sig Start Date End Date Taking? Authorizing Provider  hydrOXYzine (ATARAX/VISTARIL) 10 MG tablet Take 1 tablet (10 mg total) by mouth 3 (three) times daily as needed (Stress/anxiety). 10/01/20   Petrucelli, Samantha R, PA-C  ibuprofen (ADVIL,MOTRIN) 600 MG tablet Take 1 tablet (600 mg total) by mouth every 6 (six) hours as needed. 05/09/17   Aviva Kluver B, PA-C  mupirocin ointment (BACTROBAN) 2 % Apply 1 application topically 2 (two) times daily. 05/08/17   Ofilia Neas, PA-C  naproxen (NAPROSYN) 500 MG tablet Take 1 tablet (500 mg total) by mouth 2 (two) times daily with a meal. 05/08/17   Ofilia Neas, PA-C    Allergies    Patient has no known allergies.  Review of Systems   Review of Systems  Constitutional:  Negative for chills and fever.  HENT:  Positive for congestion, postnasal drip, rhinorrhea, sinus pain and sore throat. Negative for ear pain.   Eyes:  Negative for pain and visual disturbance.  Respiratory:  Positive for shortness of breath. Negative for cough.   Cardiovascular:  Negative for chest pain and palpitations.  Gastrointestinal:  Negative for abdominal pain and vomiting.  Genitourinary:  Negative for dysuria and hematuria.  Musculoskeletal:  Negative for arthralgias and back pain.  Skin:  Negative for color change and rash.  Neurological:  Negative for seizures and syncope.  All other systems reviewed and are negative.  Physical Exam Updated Vital Signs BP 111/73   Pulse 64   Temp 98 F (36.7 C) (Oral)   Resp 17   LMP 09/22/2020   SpO2 97%   Physical Exam Vitals and nursing note reviewed.  Constitutional:      General: She is not in acute distress.    Appearance: She is well-developed. She is not ill-appearing.  HENT:     Head: Normocephalic and atraumatic.     Right Ear: Tympanic membrane normal.     Left Ear: Tympanic membrane normal.     Nose: Congestion present.     Mouth/Throat:     Mouth: Mucous membranes are moist.     Pharynx: No oropharyngeal exudate or posterior oropharyngeal erythema.     Tonsils: No tonsillar exudate or tonsillar abscesses.  Eyes:     Extraocular Movements: Extraocular movements intact.     Conjunctiva/sclera: Conjunctivae normal.     Pupils: Pupils are equal, round, and reactive to light.  Cardiovascular:     Rate and Rhythm: Normal rate and regular  rhythm.     Pulses: Normal pulses.     Heart sounds: Normal heart sounds. No murmur heard. Pulmonary:     Effort: Pulmonary effort is normal. No respiratory distress.     Breath sounds: Normal breath sounds. No wheezing.  Abdominal:     Palpations: Abdomen is soft.     Tenderness: There is no abdominal tenderness.  Musculoskeletal:     Cervical back: Normal range of motion and neck supple.  Skin:    General: Skin is warm and dry.     Capillary Refill: Capillary refill takes less than 2 seconds.  Neurological:     General: No focal deficit present.     Mental Status: She is alert.    ED Results / Procedures / Treatments   Labs (all labs ordered are listed, but only abnormal results are displayed) Labs Reviewed  GROUP A STREP BY PCR  RESP PANEL BY RT-PCR (FLU A&B, COVID) ARPGX2  I-STAT BETA HCG BLOOD, ED ([facility], [facility], [facility] ONLY)    EKG None  Radiology No results found.  Procedures Procedures   Medications Ordered in ED Medications  dexamethasone (DECADRON) tablet 10 mg (has no administration in time range)    ED Course  I have reviewed the triage vital signs and the nursing notes.  Pertinent labs & imaging results that were available during my care of the patient were reviewed by me and considered in my medical decision making (see chart for details).    MDM Rules/Calculators/A&P                          Leslie Fox is here with nasal congestion, sore throat.  Had a cardiac work-up and PE work-up last week that was normal.  Chest x-ray that was normal.  She has been on allergy medication with minimal help.  Overall she has nasal congestion on exam.  No obvious signs of throat infection.  Clear breath sounds.  No respiratory distress.  Normal vitals.  COVID test, strep test negative.  Overall suspect allergies.  Given a dose of Decadron.  Recommend follow-up with primary care doctor.  Discharged in good condition.  This chart was dictated using voice recognition  software.  Despite best efforts to proofread,  errors can occur which can change the documentation meaning.   Final Clinical Impression(s) / ED Diagnoses Final diagnoses:  Sore throat  Nasal congestion    Rx / DC Orders ED Discharge Orders     None        Virgina Norfolk, DO 10/06/20 2114

## 2021-01-06 DIAGNOSIS — J309 Allergic rhinitis, unspecified: Secondary | ICD-10-CM | POA: Insufficient documentation

## 2021-03-25 DIAGNOSIS — R519 Headache, unspecified: Secondary | ICD-10-CM

## 2021-03-25 DIAGNOSIS — G43909 Migraine, unspecified, not intractable, without status migrainosus: Secondary | ICD-10-CM | POA: Insufficient documentation

## 2021-03-25 NOTE — Discharge Instructions (Signed)
Keep a diary of your headaches describing the time, how long they last, the date and what you are doing at the onset. Your work-up today was reassuring, CT head was negative. Follow-up with neurology as scheduled.

## 2021-03-25 NOTE — ED Provider Notes (Signed)
Emergency Medicine Provider Triage Evaluation Note  Leslie Fox , a 25 y.o. female  was evaluated in triage.  Pt complains of headache.  Started about 2 months ago and she heard a popping sensation in her head.  Initially the headaches were intermittent, she called neurology to set up an appointment but they are unable to see her until February.  Starting this month the headaches to become every day, they described as different as sometimes unilateral sometimes all over the head, sometimes feel like a "dripping inside of her skull".  No vision changes but she is having nausea and vomiting..  Review of Systems  Positive: HA, n, v Negative: syncope  Physical Exam  BP 131/81 (BP Location: Right Arm)    Pulse 82    Temp 98.6 F (37 C) (Oral)    Resp 18    Ht 5\' 1"  (1.549 m)    Wt 49.9 kg    LMP 03/15/2021 (Approximate)    SpO2 100%    BMI 20.78 kg/m  Gen:   Awake, tearful Resp:  Normal effort  MSK:   Moves extremities without difficulty  Other:  Cranial nerves III through XII are grossly intact.  No nystagmus, grip strength equal bilaterally and ambulatory with steady gait  Medical Decision Making  Medically screening exam initiated at 4:28 PM.  Appropriate orders placed.  Zykira Matlack was informed that the remainder of the evaluation will be completed by another provider, this initial triage assessment does not replace that evaluation, and the importance of remaining in the ED until their evaluation is complete.  Suspect typical migraine the patient has not had a CT head since 2001 is unable to see neuro until February of next year.  We will order a CT    March, PA-C 03/25/21 1630    [place], 03/27/21, DO 03/25/21 1726

## 2021-03-25 NOTE — ED Triage Notes (Signed)
Patient reports feeling a popping sensation in her head 1 month ago and has progressively worsening headaches since then. She reports the headaches are in different areas but are usually unilateral. She also reports nausea and sensitivity to noise. No prior history of headaches or migraines. Reports no relief with advil and ibuprofen. Denies blurry vision.

## 2021-03-25 NOTE — ED Provider Notes (Signed)
[facility] DEPT Provider Note   CSN: [id number] Arrival date & time: 03/25/21  1558     History Chief Complaint  Patient presents with   Migraine    Leslie Fox is a 25 y.o. female.  HPI  Patient presents with migraines/HA  Started about 2 months ago acutely. She heard a popping sensation in her head.  Initially the headaches were intermittent, she called neurology to set up an appointment but they are unable to see her until February.  The HA have been intermittent since October happening 3x weekly. Starting two weeks ago the headaches to become every day, they are characterized in different ways. Sometimes unilateral, sometimes all over the head, sometimes feel like a "dripping inside of her skull".  No vision changes but she is having occasional nausea and vomiting.. Duration of the headache changes well, sometimes it lasts all day and sometimes it only lasts for a few minutes.  Headaches typically are alleviated with Motrin and Tylenol. Currently the patient is not having any headaches.  History reviewed. No pertinent past medical history.  There are no problems to display for this patient.   Past Surgical History:  Procedure Laterality Date   BREAST ENHANCEMENT SURGERY       OB History   No obstetric history on file.     Family History  Problem Relation Age of Onset   Hypertension Mother     Social History   Tobacco Use   Smoking status: Never   Smokeless tobacco: Never  Substance Use Topics   Alcohol use: No   Drug use: No    Home Medications Prior to Admission medications   Medication Sig Start Date End Date Taking? Authorizing Provider  hydrOXYzine (ATARAX/VISTARIL) 10 MG tablet Take 1 tablet (10 mg total) by mouth 3 (three) times daily as needed (Stress/anxiety). 10/01/20   Petrucelli, Samantha R, PA-C  ibuprofen (ADVIL,MOTRIN) 600 MG tablet Take 1 tablet (600 mg total) by mouth every 6 (six) hours as needed. 05/09/17    Langston Masker B, PA-C  mupirocin ointment (BACTROBAN) 2 % Apply 1 application topically 2 (two) times daily. 05/08/17   Tereasa Coop, PA-C  naproxen (NAPROSYN) 500 MG tablet Take 1 tablet (500 mg total) by mouth 2 (two) times daily with a meal. 05/08/17   Tereasa Coop, PA-C    Allergies    Patient has no known allergies.  Review of Systems   Review of Systems  Constitutional:  Negative for fever.  Eyes:  Positive for photophobia. Negative for visual disturbance.  Gastrointestinal:  Positive for nausea and vomiting.  Musculoskeletal:  Negative for neck stiffness.  Allergic/Immunologic: Negative for immunocompromised state.  Neurological:  Positive for headaches. Negative for dizziness, seizures, syncope, weakness and numbness.  Psychiatric/Behavioral:  Negative for confusion.    Physical Exam Updated Vital Signs BP 134/84    Pulse 74    Temp 98.7 F (37.1 C) (Oral)    Resp 18    Ht 5\' 1"  (1.549 m)    Wt 49.9 kg    LMP 03/15/2021 (Approximate)    SpO2 96%    BMI 20.78 kg/m   Physical Exam Vitals and nursing note reviewed. Exam conducted with a chaperone present.  Constitutional:      Appearance: Normal appearance.  HENT:     Head: Normocephalic and atraumatic.     Right Ear: Tympanic membrane normal.     Left Ear: Tympanic membrane normal.  Eyes:     General: No  scleral icterus.       Right eye: No discharge.        Left eye: No discharge.     Extraocular Movements: Extraocular movements intact.     Pupils: Pupils are equal, round, and reactive to light.  Neck:     Comments: No carotid bruits Cardiovascular:     Rate and Rhythm: Normal rate and regular rhythm.     Pulses: Normal pulses.     Heart sounds: Normal heart sounds. No murmur heard.   No friction rub. No gallop.  Pulmonary:     Effort: Pulmonary effort is normal. No respiratory distress.     Breath sounds: Normal breath sounds.  Abdominal:     General: Abdomen is flat. Bowel sounds are normal. There is  no distension.     Palpations: Abdomen is soft.     Tenderness: There is no abdominal tenderness.  Musculoskeletal:     Cervical back: Normal range of motion. No tenderness.  Skin:    General: Skin is warm and dry.     Coloration: Skin is not jaundiced.  Neurological:     Mental Status: She is alert. Mental status is at baseline.     Coordination: Coordination normal.     Comments: Cranial nerves III through XII are grossly intact.  Grip strength is equal bilaterally, ambulatory with steady gait.  No dysarthria, no nystagmus.   ED Results / Procedures / Treatments   Labs (all labs ordered are listed, but only abnormal results are displayed) Labs Reviewed - No data to display  EKG None  Radiology CT Head Wo Contrast  Result Date: 03/25/2021 CLINICAL DATA:  Provided history: Headache, chronic, new features or increased frequency. Associated nausea and vomiting. EXAM: CT HEAD WITHOUT CONTRAST TECHNIQUE: Contiguous axial images were obtained from the base of the skull through the vertex without intravenous contrast. COMPARISON:  Report from head CT 12/20/1999 (images unavailable). FINDINGS: Brain: Cerebral volume is normal. There is no acute intracranial hemorrhage. No demarcated cortical infarct. No extra-axial fluid collection. No evidence of an intracranial mass. No midline shift. Vascular: No hyperdense vessel. Skull: Normal. Negative for fracture or focal lesion. Sinuses/Orbits: Visualized orbits show no acute finding. No significant paranasal sinus disease at the imaged levels. IMPRESSION: Unremarkable non-contrast CT appearance of the brain. No evidence of acute intracranial abnormality. Electronically Signed   By: Jackey Loge D.O.   On: 03/25/2021 17:03    Procedures Procedures   Medications Ordered in ED Medications  ibuprofen (ADVIL) tablet 600 mg (600 mg Oral Given 03/25/21 1731)    ED Course  I have reviewed the triage vital signs and the nursing notes.  Pertinent labs  & imaging results that were available during my care of the patient were reviewed by me and considered in my medical decision making (see chart for details).    MDM Rules/Calculators/A&P                         Patient is stable vitals.  Neuro exam is benign without any focal deficits.  Currently she is not having a headache and declines any analgesics.  No recent trauma to the head.  On chart review patient had lab work-up in June of this year.  She was slightly hypokalemic but no gross electrolyte derangement.  Do not feel strongly she needs repeat labs at this time.  We will start with CT head to evaluate for any acute process, overall I have low suspicion  for intracranial bleed or intracranial mass but given the change in frequency and severity and duration of her headaches I think is reasonable to proceed with CT scan.   Patient reports return of headache after CT scan.  Ordered Motrin.  CT negative for any acute process.  She is mentating well and no focal deficits on repeat neurologic exam.  She is already followed by neurology and has an MRI in the next week.  At this point I do not see reason for emergent MRI or additional work-up.  Suspect given her symptoms been going for 2 months this is possibly an atypical migraine presentation, low suspicion for emergent process.  Not consistent with SAH, GCA, stroke, TIA, vascular headache.  Patient discharged in stable condition.     Final Clinical Impression(s) / ED Diagnoses Final diagnoses:  Bad headache    Rx / DC Orders ED Discharge Orders     None        Sherrill Raring, Hershal Coria 03/25/21 2053    Isla Pence, MD 03/25/21 2102

## 2021-03-26 DIAGNOSIS — R519 Headache, unspecified: Secondary | ICD-10-CM

## 2021-05-10 DIAGNOSIS — R519 Headache, unspecified: Secondary | ICD-10-CM

## 2021-05-10 DIAGNOSIS — O219 Vomiting of pregnancy, unspecified: Secondary | ICD-10-CM

## 2021-05-10 DIAGNOSIS — Z3A01 Less than 8 weeks gestation of pregnancy: Secondary | ICD-10-CM

## 2021-05-10 NOTE — Progress Notes (Signed)
Virtual Visit Consent   Leslie Fox, you are scheduled for a virtual visit with a [facility] provider today.     Just as with appointments in the office, your consent must be obtained to participate.  Your consent will be active for this visit and any virtual visit you may have with one of our providers in the next 365 days.     If you have a MyChart account, a copy of this consent can be sent to you electronically.  All virtual visits are billed to your insurance company just like a traditional visit in the office.    As this is a virtual visit, video technology does not allow for your provider to perform a traditional examination.  This may limit your provider's ability to fully assess your condition.  If your provider identifies any concerns that need to be evaluated in person or the need to arrange testing (such as labs, EKG, etc.), we will make arrangements to do so.     Although advances in technology are sophisticated, we cannot ensure that it will always work on either your end or our end.  If the connection with a video visit is poor, the visit may have to be switched to a telephone visit.  With either a video or telephone visit, we are not always able to ensure that we have a secure connection.     I need to obtain your verbal consent now.   Are you willing to proceed with your visit today?    Leslie Fox has provided verbal consent on 05/10/2021 for a virtual visit (video or telephone).   Leslie Rodney, FNP   Date: 05/10/2021 5:40 PM   Virtual Visit via Video Note   I, Leslie Fox, connected with  Leslie Fox  ([id number], 20-Nov-1995) on 05/10/21 at  5:45 PM EST by a video-enabled telemedicine application and verified that I am speaking with the correct person using two identifiers.  Location: Patient: Virtual Visit Location Patient: Home Provider: Virtual Visit Location Provider: Home Office   I discussed the limitations of evaluation and management by  telemedicine and the availability of in person appointments. The patient expressed understanding and agreed to proceed.    History of Present Illness: Leslie Fox is a 26 y.o. who identifies as a female who was assigned female at birth, and is being seen today for pregnancy approx 7 weeks. She is nauseous and vomiting 1-2 times a day. She also reports she gets a mild headache. She is not   HPI: HPI  Problems: There are no problems to display for this patient.   Allergies: No Known Allergies Medications:  Current Outpatient Medications:    Doxylamine-Pyridoxine 10-10 MG TBEC, Take 2 tablets by mouth at bedtime., Disp: 60 tablet, Rfl: 1   hydrOXYzine (ATARAX/VISTARIL) 10 MG tablet, Take 1 tablet (10 mg total) by mouth 3 (three) times daily as needed (Stress/anxiety)., Disp: 15 tablet, Rfl: 0   ibuprofen (ADVIL,MOTRIN) 600 MG tablet, Take 1 tablet (600 mg total) by mouth every 6 (six) hours as needed., Disp: 20 tablet, Rfl: 0   mupirocin ointment (BACTROBAN) 2 %, Apply 1 application topically 2 (two) times daily., Disp: 22 g, Rfl: 0   naproxen (NAPROSYN) 500 MG tablet, Take 1 tablet (500 mg total) by mouth 2 (two) times daily with a meal., Disp: 30 tablet, Rfl: 0  Observations/Objective: Patient is well-developed, well-nourished in no acute distress.  Resting comfortably  at home.  Head is normocephalic, atraumatic.  No labored breathing.  Speech is clear and coherent with logical content.  Patient is alert and oriented at baseline.    Assessment and Plan: 1. Less than [redacted] weeks gestation of pregnancy - Doxylamine-Pyridoxine 10-10 MG TBEC; Take 2 tablets by mouth at bedtime.  Dispense: 60 tablet; Refill: 1  2. Nausea and vomiting during pregnancy - Doxylamine-Pyridoxine 10-10 MG TBEC; Take 2 tablets by mouth at bedtime.  Dispense: 60 tablet; Refill: 1  3. Acute nonintractable headache, unspecified headache type  Start prenatal  Force fluids Need to drink 8-12 cups of water a  day Keep GYN appointment  If ginger supplement does not help, can try rx of nausea at bedtime Bland diet  Follow Up Instructions: I discussed the assessment and treatment plan with the patient. The patient was provided an opportunity to ask questions and all were answered. The patient agreed with the plan and demonstrated an understanding of the instructions.  A copy of instructions were sent to the patient via MyChart unless otherwise noted below.     The patient was advised to call back or seek an in-person evaluation if the symptoms worsen or if the condition fails to improve as anticipated.  Time:  I spent 12 minutes with the patient via telehealth technology discussing the above problems/concerns.    Leslie Rodney, FNP

## 2021-05-14 DIAGNOSIS — Z3A08 8 weeks gestation of pregnancy: Secondary | ICD-10-CM | POA: Insufficient documentation

## 2021-05-14 DIAGNOSIS — O4690 Antepartum hemorrhage, unspecified, unspecified trimester: Secondary | ICD-10-CM

## 2021-05-14 DIAGNOSIS — N898 Other specified noninflammatory disorders of vagina: Secondary | ICD-10-CM | POA: Insufficient documentation

## 2021-05-14 DIAGNOSIS — O209 Hemorrhage in early pregnancy, unspecified: Secondary | ICD-10-CM | POA: Insufficient documentation

## 2021-05-14 NOTE — Discharge Instructions (Signed)
Follow-up with your OB/GYN as discussed.  If you have any worsening symptoms including heavier bleeding, abdominal pain, dizziness or other worsening symptoms, you should proceed to the [facility] associated with [facility] ([facility]).

## 2021-05-14 NOTE — ED Triage Notes (Signed)
Patient brought in via POV from home. Patient states she is 8 weeks preg. Patient states went to restroom with morning and had small amount of bleeding without pain.

## 2021-05-14 NOTE — ED Provider Notes (Signed)
[facility] DEPT Provider Note   CSN: [id number] Arrival date & time: 05/14/21  0848     History  Chief Complaint  Patient presents with   Vaginal Bleeding    Leslie Fox is a 26 y.o. female.  Patient is a 26 year old female who presents with vaginal bleeding.  She says her last menstrual period was in November and she feels like she is about [redacted] weeks pregnant.  This is her first pregnancy.  She went to the bathroom this morning and when she wiped there was some blood on the tissue.  She has not had any ongoing bleeding.  She has no associate abdominal pain.  No dizziness.  No urinary symptoms.  She did have some nausea associated with her pregnancy but recently started doxylamine pyridoxine for this and is feeling better.  Her first OB/GYN appointment is February 14.      Home Medications Prior to Admission medications   Medication Sig Start Date End Date Taking? Authorizing Provider  Doxylamine-Pyridoxine 10-10 MG TBEC Take 2 tablets by mouth at bedtime. 05/10/21   Evelina Dun A, FNP  hydrOXYzine (ATARAX/VISTARIL) 10 MG tablet Take 1 tablet (10 mg total) by mouth 3 (three) times daily as needed (Stress/anxiety). 10/01/20   Petrucelli, Glynda Jaeger, PA-C      Allergies    Patient has no known allergies.    Review of Systems   Review of Systems  Constitutional:  Negative for chills, diaphoresis, fatigue and fever.  HENT:  Negative for congestion, rhinorrhea and sneezing.   Eyes: Negative.   Respiratory:  Negative for cough, chest tightness and shortness of breath.   Cardiovascular:  Negative for chest pain and leg swelling.  Gastrointestinal:  Negative for abdominal pain, blood in stool, diarrhea, nausea and vomiting.  Genitourinary:  Positive for vaginal bleeding. Negative for difficulty urinating, flank pain, frequency, hematuria and vaginal discharge.  Musculoskeletal:  Negative for arthralgias and back pain.  Skin:  Negative for rash.   Neurological:  Negative for dizziness, speech difficulty, weakness, numbness and headaches.   Physical Exam Updated Vital Signs BP (!) 103/55    Pulse 68    Temp 98.1 F (36.7 C)    Resp 18    Ht 5\' 1"  (1.549 m)    Wt 49.9 kg    LMP 03/15/2021 (Approximate)    SpO2 100%    BMI 20.78 kg/m  Physical Exam Constitutional:      Appearance: She is well-developed.  HENT:     Head: Normocephalic and atraumatic.  Eyes:     Pupils: Pupils are equal, round, and reactive to light.  Cardiovascular:     Rate and Rhythm: Normal rate and regular rhythm.     Heart sounds: Normal heart sounds.  Pulmonary:     Effort: Pulmonary effort is normal. No respiratory distress.     Breath sounds: Normal breath sounds. No wheezing or rales.  Chest:     Chest wall: No tenderness.  Abdominal:     General: Bowel sounds are normal.     Palpations: Abdomen is soft.     Tenderness: There is no abdominal tenderness. There is no guarding or rebound.  Genitourinary:    Comments: No bleeding noted, thick white discharge, no cervical motion tenderness or adnexal tenderness Musculoskeletal:        General: Normal range of motion.     Cervical back: Normal range of motion and neck supple.  Lymphadenopathy:     Cervical: No cervical adenopathy.  Skin:    General: Skin is warm and dry.     Findings: No rash.  Neurological:     Mental Status: She is alert and oriented to person, place, and time.    ED Results / Procedures / Treatments   Labs (all labs ordered are listed, but only abnormal results are displayed) Labs Reviewed  HCG, QUANTITATIVE, PREGNANCY - Abnormal; Notable for the following components:      Result Value   hCG, Beta Chain, Quant, S [id number] (*)    All other components within normal limits  WET PREP, GENITAL  ABO/RH  GC/CHLAMYDIA PROBE AMP ([facility]) NOT AT [facility]    EKG None  Radiology US OB Comp < 14 Wks  Result Date: 05/14/2021 CLINICAL DATA:  vaginal bleeding in pregnancy EXAM:  OBSTETRIC <14 WK ULTRASOUND TECHNIQUE: Transabdominal ultrasound was performed for evaluation of the gestation as well as the maternal uterus and adnexal regions. COMPARISON:  None. FINDINGS: Intrauterine gestational sac: Single Yolk sac:  Not Visualized. Embryo:  Visualized. Cardiac Activity: Visualized. Heart Rate: 166 bpm CRL:   1.7 mm   8 w 1 d                  [place] EDC: 12/23/2021 Subchorionic hemorrhage:  None visualized. Maternal uterus/adnexae: Normal left ovary. Corpus luteal cyst in the right ovary measuring up to 3.9 cm. IMPRESSION: Single intrauterine pregnancy with detectable cardiac activity. Estimated gestational age [redacted] weeks 1 day. Electronically Signed   By: Maurine Simmering M.D.   On: 05/14/2021 10:37    Procedures Procedures    Medications Ordered in ED Medications - No data to display  ED Course/ Medical Decision Making/ A&P                           Medical Decision Making Amount and/or Complexity of Data Reviewed External Data Reviewed: labs and notes.    Details: No prior ABO typing noted Labs: ordered. Decision-making details documented in ED Course. Radiology: ordered.  Risk Decision regarding hospitalization.   Patient is a 26 year old female who presents with vaginal bleeding in association with early pregnancy.  She has no associate abdominal pain.  Labs were obtained.  Her quant hCG is around 118,000.  She is Rh+.  On exam, she has no active bleeding.  Ultrasound was performed which showed an intrauterine pregnancy at 8 weeks 1 day with positive heartbeat.  She was discharged home in good condition.  She was encouraged to contact her OB/GYN for close follow-up.  Return precautions were given.  Final Clinical Impression(s) / ED Diagnoses Final diagnoses:  Vaginal bleeding in pregnancy    Rx / DC Orders ED Discharge Orders     None         Malvin Johns, MD 05/14/21 1146

## 2021-05-16 DIAGNOSIS — R519 Headache, unspecified: Secondary | ICD-10-CM

## 2021-05-16 DIAGNOSIS — O26891 Other specified pregnancy related conditions, first trimester: Secondary | ICD-10-CM

## 2021-05-16 NOTE — Patient Instructions (Signed)
°  Leslie Fox, thank you for joining Claiborne Rigg, NP for today's virtual visit.  While this provider is not your primary care provider (PCP), if your PCP is located in our provider database this encounter information will be shared with them immediately following your visit.  Consent: (Patient) Leslie Fox provided verbal consent for this virtual visit at the beginning of the encounter.  Current Medications:  Current Outpatient Medications:    acetaminophen (TYLENOL) 325 MG tablet, Take 1 tablet (325 mg total) by mouth every 6 (six) hours as needed for up to 7 days for headache., Disp: 28 tablet, Rfl: 0   Doxylamine-Pyridoxine 10-10 MG TBEC, Take 2 tablets by mouth at bedtime., Disp: 60 tablet, Rfl: 1   hydrOXYzine (ATARAX/VISTARIL) 10 MG tablet, Take 1 tablet (10 mg total) by mouth 3 (three) times daily as needed (Stress/anxiety)., Disp: 15 tablet, Rfl: 0   Medications ordered in this encounter:  Meds ordered this encounter  Medications   acetaminophen (TYLENOL) 325 MG tablet    Sig: Take 1 tablet (325 mg total) by mouth every 6 (six) hours as needed for up to 7 days for headache.    Dispense:  28 tablet    Refill:  0    Order Specific Question:   Supervising Provider    Answer:   Hyacinth Meeker, BRIAN [[id number]]     *If you need refills on other medications prior to your next appointment, please contact your pharmacy*  Follow-Up: Call back or seek an in-person evaluation if the symptoms worsen or if the condition fails to improve as anticipated.  Other Instructions Elevated legs to help with BLE soreness Stay hydrated and drink plenty of fluids to help with loose stools.  Needs to be seen by GYN if diarrhea persists along   If you have been instructed to have an in-person evaluation today at a local Urgent Care facility, please use the link below. It will take you to a list of all of our available [facility], including address, phone number and hours of operation.  Please do not delay care.  [facility]  If you or a family member do not have a primary care provider, use the link below to schedule a visit and establish care. When you choose a [facility] primary care physician or advanced practice provider, you gain a long-term partner in health. Find a Primary Care Provider  Learn more about [facility] in-office and virtual care options: [facility] - Get Care Now

## 2021-05-16 NOTE — Progress Notes (Signed)
Virtual Visit Consent   Leslie Fox, you are scheduled for a virtual visit with a [facility] provider today.     Just as with appointments in the office, your consent must be obtained to participate.  Your consent will be active for this visit and any virtual visit you may have with one of our providers in the next 365 days.     If you have a MyChart account, a copy of this consent can be sent to you electronically.  All virtual visits are billed to your insurance company just like a traditional visit in the office.    As this is a virtual visit, video technology does not allow for your provider to perform a traditional examination.  This may limit your provider's ability to fully assess your condition.  If your provider identifies any concerns that need to be evaluated in person or the need to arrange testing (such as labs, EKG, etc.), we will make arrangements to do so.     Although advances in technology are sophisticated, we cannot ensure that it will always work on either your end or our end.  If the connection with a video visit is poor, the visit may have to be switched to a telephone visit.  With either a video or telephone visit, we are not always able to ensure that we have a secure connection.     I need to obtain your verbal consent now.   Are you willing to proceed with your visit today?    Leslie Fox has provided verbal consent on 05/16/2021 for a virtual visit (video or telephone).   Gildardo Pounds, NP   Date: 05/16/2021 6:29 PM   Virtual Visit via Video Note   I, Gildardo Pounds, connected with  Leslie Fox  ([id number], 09-Jul-1995) on 05/16/21 at  6:00 PM EST by a video-enabled telemedicine application and verified that I am speaking with the correct person using two identifiers.  Location: Patient: Virtual Visit Location Patient: Home Provider: Virtual Visit Location Provider: Home Office   I discussed the limitations of evaluation and management by  telemedicine and the availability of in person appointments. The patient expressed understanding and agreed to proceed.    History of Present Illness: Leslie Fox is a 26 y.o. who identifies as a female who was assigned female at birth, and is being seen today for Headaches, loose stools and pain in feet  She is currently about [redacted] weeks pregnant. Endorses headache since yesterday along with loose stools and "stomach ache". Denies vaginal spotting or cramping. States bilateral feet are sore but denies BLE edema.    Problems: There are no problems to display for this patient.   Allergies: No Known Allergies Medications:  Current Outpatient Medications:    acetaminophen (TYLENOL) 325 MG tablet, Take 1 tablet (325 mg total) by mouth every 6 (six) hours as needed for up to 7 days for headache., Disp: 28 tablet, Rfl: 0   Doxylamine-Pyridoxine 10-10 MG TBEC, Take 2 tablets by mouth at bedtime., Disp: 60 tablet, Rfl: 1   hydrOXYzine (ATARAX/VISTARIL) 10 MG tablet, Take 1 tablet (10 mg total) by mouth 3 (three) times daily as needed (Stress/anxiety)., Disp: 15 tablet, Rfl: 0  Observations/Objective: Patient is well-developed, well-nourished in no acute distress.  Resting comfortably  at home.  Head is normocephalic, atraumatic.  No labored breathing.  Speech is clear and coherent with logical content.  Patient is alert and oriented at baseline.    Assessment and Plan: 1.  Pregnancy headache in first trimester - acetaminophen (TYLENOL) 325 MG tablet; Take 1 tablet (325 mg total) by mouth every 6 (six) hours as needed for up to 7 days for headache.  Dispense: 28 tablet; Refill: 0 Elevated legs to help with BLE soreness Stay hydrated and drink plenty of fluids to help with loose stools.  Needs to be seen by GYN if diarrhea persists along  Follow Up Instructions: I discussed the assessment and treatment plan with the patient. The patient was provided an opportunity to ask questions and all  were answered. The patient agreed with the plan and demonstrated an understanding of the instructions.  A copy of instructions were sent to the patient via MyChart unless otherwise noted below.     The patient was advised to call back or seek an in-person evaluation if the symptoms worsen or if the condition fails to improve as anticipated.  Time:  I spent 5 minutes with the patient via telehealth technology discussing the above problems/concerns.    Gildardo Pounds, NP

## 2021-05-17 NOTE — ED Notes (Signed)
Received call from [facility] lab this morning stating the wrong swab was placed in GC/Chlamydia container for pt. Dr Rodena Medin and Dr Hyman Bower aware.

## 2021-05-18 DIAGNOSIS — Z3A00 Weeks of gestation of pregnancy not specified: Secondary | ICD-10-CM

## 2021-05-18 DIAGNOSIS — Z3480 Encounter for supervision of other normal pregnancy, unspecified trimester: Secondary | ICD-10-CM | POA: Insufficient documentation

## 2021-05-18 DIAGNOSIS — Z3400 Encounter for supervision of normal first pregnancy, unspecified trimester: Secondary | ICD-10-CM | POA: Insufficient documentation

## 2021-05-18 NOTE — Progress Notes (Signed)
New OB Intake  I connected with  Purcell Mouton on 05/18/21 at  8:15 AM EST by MyChart Video Visit and verified that I am speaking with the correct person using two identifiers. Nurse is located at [facility] and pt is located at home.  I discussed the limitations, risks, security and privacy concerns of performing an evaluation and management service by telephone and the availability of in person appointments. I also discussed with the patient that there may be a patient responsible charge related to this service. The patient expressed understanding and agreed to proceed.  I explained I am completing New OB Intake today. We discussed her EDD of 12/23/21 that is based on U/S on 05/14/21. Pt is G1/P0. I reviewed her allergies, medications, Medical/Surgical/OB history, and appropriate screenings. I informed her of [facility] services. Based on history, this is a/an  pregnancy uncomplicated .   There are no problems to display for this patient.   Concerns addressed today  Delivery Plans:  Plans to deliver at [facility].   MyChart/Babyscripts MyChart access verified. I explained pt will have some visits in office and some virtually. Babyscripts instructions given and order placed. Patient verifies receipt of registration text/e-mail. Account successfully created and app downloaded.  Blood Pressure Cuff  Patient is self-pay; explained patient will be given BP cuff at first prenatal appt. Explained after first prenatal appt pt will check weekly and document in 5.  Weight scale: Patient does / does not  have weight scale. Weight scale ordered for patient to pick up from [employer].   Anatomy US Explained first scheduled [place] will be around 19 weeks. Anatomy US scheduled for 07/29/21 at 10:30a. Pt notified to arrive at 10:15a. Scheduled AFP lab only appointment if CenteringPregnancy pt for same day as anatomy US.   Labs Discussed Johnsie Cancel genetic screening with patient. Would like both Panorama  and Horizon drawn at new OB visit.Also if interested in genetic testing, tell patient she will need AFP 15-21 weeks to complete genetic testing .Routine prenatal labs needed.  Covid Vaccine Patient has covid vaccine.   CenteringPregnancy Candidate?  If yes, offer as possibility  Mother/ Baby Dyad Candidate?   Accepted If yes, offer as possibility  Informed patient of [facility] website  and placed link in her AVS.   Social Determinants of Health Food Insecurity: Patient denies food insecurity. WIC Referral: Patient is interested in referral to [facility].  Transportation: Patient denies transportation needs. Childcare: Discussed no children allowed at ultrasound appointments. Offered childcare services; patient declines childcare services at this time.  Send link to Pregnancy Navigators   Placed OB Box on problem list and updated  First visit review I reviewed new OB appt with pt. I explained she will have a pelvic exam, ob bloodwork with genetic screening, and PAP smear. Explained pt will be seen by Dr. Dione Plover at first visit; encounter routed to appropriate provider. Explained that patient will be seen by pregnancy navigator following visit with provider. [facility] information placed in AVS.   Bethanne Ginger, [place] 05/18/2021  8:32 AM

## 2021-05-18 NOTE — Patient Instructions (Addendum)
AREA PEDIATRIC/FAMILY PRACTICE PHYSICIANS  Central/Southeast [place] ([address]) [facility] Chambliss, MD; Eniola, MD; Hale, MD; Hensel, MD; McDiarmid, MD; McIntyer, MD; Coutney Wildermuth, MD; Walden, MD [address] [place], [place] [address] [phone] Mon-Fri 8:30-12:30, 1:30-5:00 Providers come to see babies at [facility] Accepting Medicaid [facility] providers who accept newborns: Koirala, MD; Morrow, MD; Wolters, MD [address], [place], [place] [address] [phone] Mon-Fri 8:00-5:30 Babies seen by providers at [facility] Does NOT accept Medicaid Please call early in hospitalization for appointment (limited availability)  [facility] Mulberry, MD [address] [place], [place] [address] [phone] Mon, Tue, Thur, Fri 8:30-5:00, Wed 10:00-7:00 (closed 1-2pm) Babies seen by [facility] providers Accepting Medicaid Rubin - Pediatrician Rubin, MD [address], [place], [place] [address] [phone] Mon-Fri 8:30-5:00, Sat 8:30-12:00 Provider comes to see babies at [facility] Accepting Medicaid Must have been referred from current patients or contacted office prior to delivery [facility] ([facility]) Brown, MD; Chandler, MD; Ettefagh, MD; Grant, MD; Lester, MD; McCormick, MD; McQueen, MD; Prose, MD; Simha, MD; [place], MD; Stryffeler, NP; Tebben, NP [address], [place], [place] [address] [phone] Mon, Tue, Thur, Fri 8:30-5:30, Wed 9:30-5:30, Sat 8:30-12:30 Babies seen by [facility] providers Accepting Medicaid Only accepting infants of first-time parents or siblings of current patients Hospital discharge coordinator will make follow-up appointment Jack Amos [address], [place], [place]  [address] [phone]   Fax - [phone] [facility] [address], [place], [place]  [address] Phone - [phone]   Fax - [phone] Shilpa Gosrani [address], [place], [place]  [address] [phone]  [place]ortheast [place] ([address]) [facility] Bates, MD; Brassfield, MD; Cooper, Cox, MD; MD; Davis, MD; Dovico, MD; Ettefaugh, MD; Little, MD; Lowe, MD; Keiffer, MD; Melvin, MD; Sumner, MD; Williams, MD [address], [place], [place] [address] [phone] Mon-Fri 8:30-5:00 (extended evenings Mon-Thur as needed), Sat-Sun 10:00-1:00 Providers come to see babies at [facility] Accepting Medicaid for families of first-time babies and families with all children in the household age 3 and under. Must register with office prior to making appointment (M-F only). [facility] Henson, NP; Knapp, MD; Lalonde, MD; Tysinger, PA [address] [place], [place] [address] [phone] Mon-Fri 8:00-5:00 Babies seen by providers at [facility] Does NOT accept Medicaid/Commercial Insurance Only [facility] ([facility])  Artis, MD; Barnes, MD; Bratton, MD; Coccaro, MD; Lockett [place], MD; Kramer, MD; Marshall, MD; Netherton, MD; Poleto, MD; Skinner, MD [address] [place], [place] [address] [phone] Mon-Fri 8:30-5:30, Sat (Oct.-Mar.) 9:00-1:00 Babies seen by providers at [facility] Accepting Medicaid  [place] ([address]) [facility] Reid, MD; Warner, MD [address], [place], [place] [address] [phone] Mon-Fri 8:30-5:00, Sat 8:30-12:00 Providers come to see babies at [facility] Does NOT accept Medicaid [facility] Becker, PA; Hagler, MD; Scifres, PA; Sun, MD; Swayne, MD [address], , [place] [address] [phone] Mon-Fri 8:00-5:00 Babies seen by providers at [facility] Does NOT accept Medicaid Only accepting babies of parents who  are patients Please call early in hospitalization for appointment (limited availability)  Pediatricians Clark, MD; Frye, MD; Kelleher, MD; Mack, NP; Miller, MD; O'Keller, MD; Patterson, NP; Pudlo, MD; Puzio, MD; Thomas, MD; Tucker, MD; Twiselton, MD [address], [place], [place] [address] [phone] Mon-Fri 8:00-5:00, Sat 9:00-12:00 Providers come to see babies at [facility] Does NOT accept Medicaid  [place] ([address]) [facility] providers accepting new patients: Brake, NP; Wharton, PA [address], [place], [place] [address] [phone] Mon-Fri 8:00-5:00 Babies seen by providers at [facility] Does NOT accept Medicaid Only accepting babies of parents who are patients Please call early in hospitalization for appointment (limited availability) [facility] Gay, MD; Quinlan, MD [address] [place], [place] [address] [phone] (press 1 to schedule appointment) Mon-Fri 8:00-5:00 Providers come to see babies at [facility] Does NOT accept Medicaid [facility] Mazer, MD [address] [place], [place] [address] [phone] Mon-Fri 8:30-5:00 (lunch 12:30-1:00), extended hours by appointment only Wed 5:00-6:30 Babies seen by [facility] providers Accepting Medicaid [facility] Banks, MD; Jordan, MD; Koberlein, MD [address], [place], [place] [address] [phone] Mon-Fri 8:00-5:00 Babies seen by [facility] providers Does NOT accept Medicaid [facility] Parker, MD; Hunter, MD; Wallace, DO [address] [place], [place] [address] [phone] Mon-Fri 8:00-5:00 Babies seen by [facility] providers Does NOT accept Medicaid [facility] Brandon, PA; Brecken, PA; Christy, NP; Dees, MD; DeClaire, MD; DeWeese, MD; Hansen, NP; Mills, NP; Parrish, NP; Smoot, NP; Summer, MD; Vapne,  MD [address] [place], [place] [address] [phone] Mon-Fri 8:30-5:00, Sat 10:00-1:00 Providers come to see babies at [facility] Does NOT accept Medicaid Free prenatal information session Tuesdays at 4:45pm [facility] Bouska, MD; Gordon, PA; Jeffery, PA; Weber, PA [address] [place] [address] [phone] Mon-Fri 7:30-5:30 Babies seen by [facility] providers [facility] [address], [place], [place]  [address] [phone]   Fax - [phone]  [place] ([address] & [address]) [facility] [place] [address] [place], [place] [address] [phone] Mon-Thur 8:00-6:00 Providers come to see babies at [facility] Accepting Medicaid [facility] Anderson, NP; Badger, MD; Beal, PA; Spencer, PA [address] [place], [place] [address] [phone] Mon-Thur 7:30-7:30, Fri 7:30-4:30 Babies seen by [facility] providers Accepting Medicaid [facility] Agbuya, MD; Klett, NP; Romgoolam, MD [address] [place] [address], [place], [place] [address] [phone] Mon-Fri 8:30-5:00, Sat 8:30-12:00 Providers come to see babies at [facility] Accepting Medicaid Must have "Meet & Greet" appointment at office prior to delivery [facility] McCord, MD; Wallace, MD; Wood, MD [address] [place] [address], [place], [place] [address] [phone] Mon-Wed 8:00-6:00, Thur-Fri 8:00-5:00, Sat 9:00-12:00 Providers come to see babies at [facility] Does NOT accept Medicaid Only accepting siblings of current patients [facility]  [address], [place], [place]  [address] [phone]   Fax - [phone] [facility] [address], [place], [place]  [address] [phone]   Fax -  [phone]  [place] ([address] & [address]) [facility] Cirigliano, DO; Matthews, DO [address] , [place] [address] [phone] Mon-Fri 7:00-5:00 Babies seen by [facility] providers Does NOT accept Medicaid [facility] Briscoe, MD; Howley, PA; Moreira, PA [address], [place], [place] [address] [phone] Mon-Fri 8:00-5:00 Babies seen by [facility] providers Accepting Medicaid [facility] Boyd, MD; Church, PA; Jones, NP; Osborn, PA [address], , [place] [address] [phone] Mon-Fri 8:00-5:00 Babies seen by providers at [facility] Accepting Medicaid  [place] ([address]) [facility] [place] Wendling, DO [address] [place], [place] [address] [phone] Mon-Fri 8:00-5:00 Babies seen by [facility] providers Does NOT accept Medicaid Limited availability, please call early in hospitalization to schedule follow-up [facility] Calderon, PA; Cummings, MD; Dillard, MD; Martin, PA; Olson, MD; VanDeven, PA [address], [place], [place] [address] [phone] Mon-Fri 8:30-5:00, Sat 9:00-12:00 Babies seen by providers at [facility] Accepting Medicaid Please register online then schedule online or call office [email or web address] Hunter, NP; Kumar, MD; Martin [place], PA [address], [place], [place] [address] [phone] Mon-Fri 8:00-5:00 Babies seen by providers at [facility] Accepting Medicaid [facility] ([facility]) [place], MD; Kristi Fleenor, NP; West, MD [address], [place], [place] [address] [phone] Mon-Fri 8:00-5:30, Sat&Sun by appointment (phones open at  8:30) Babies seen by [facility] providers Accepting Medicaid Must be a first-time baby or sibling of current patient [facility] [place]  [address], [place], [place]  [address] [phone]   Fax - [phone]  [place] ([address]) [facility] Brown, PA; Cowen, PA; Rice, MD; Helton, PA; Spry, MD [address] [place], [place] [address] [phone] Mon-Thur 8:00-7:00, Fri 8:00-5:00, Sat 8:00-12:00, Sun 9:00-12:00 Babies seen by [facility] providers Accepting Medicaid [facility] Coe-Goins, MD; Marshall, MD; Pierre-Louis, MD [address], [place], [place] [address] [phone] Mon-Thur 8:00-5:00 Babies seen by providers at [facility] Accepting Medicaid [facility] Bratton, MD; Coe-Goins, MD; Hayes, MD; Lewis, MD; List, MD; Lott, MD; Marshall, MD; Moran, MD; [place] Standish, MD; Pierre-Louis, MD; Pitonzo, MD; Scholer, MD; Spangle, MD [address] [place], [place] [address] [phone] Mon-Fri 8:00-5:30, Sat (Oct.-Mar.) 9:00-1:00 Babies seen by providers at [facility] Accepting Medicaid Must fill out new patient packet, available online at [email or web address] [facility] ([facility]) Friddle, NP; Harris, NP; Kelly, NP; Logan, MD; Melvin, PA; Poth, MD; Ramadoss, MD; Stanton, NP [address], [place], [place] [address] [phone] Mon-Thur 8:00-5:30, Fri 8:00-5:00 Babies seen by providers at [facility] Accepting Medicaid  [place] ([address]) [facility] Dixon, PA; Bigelow, MD; Pickard, MD; Tapia, PA [address], [place], [place] [address] [phone] Mon-Fri 8:00-5:00 Babies seen by providers at [facility] Accepting Medicaid   [place] ([address]) [facility] [place] Masneri, DO; Meyers, MD; Nelson, PA [address], [place], [place] [address] [phone] Mon-Fri 8:00-5:00 Babies seen by providers at [facility] Does NOT accept Medicaid Limited appointment availability, please call early in hospitalization  [facility] [place] Kunedd, DO; McGowen, MD [address], [place], [place] [address] [phone] Mon-Fri 8:00-5:00 Babies seen by [facility] providers Does NOT accept Medicaid [facility] [place] Cameron, MD; MacDonald, MD; Michaels, PA; Nayak, MD [address] [place] [address], [place], [place] [address] [phone] Mon-Fri 8:00-5:00 After hours clinic ([address] [place],  [address]) [phone] Mon-Fri 5:00-8:00, Sat 12:00-6:00, Sun 10:00-4:00 Babies seen by [facility] providers Accepting Medicaid [facility] [place] [address], [place], [place]  [address] [phone]   Fax - [phone]  [place] ([address]) [facility] [place] [facility] Andy, MD [address], [place], [place] [address] [phone] Mon-Fri 8:00-5:00 Babies seen by [facility] providers Does NOT accept Medicaid [facility] [place] ([facility] [place]) Eksir, MD [address], [place], [place] [address] [phone] Mon-Thur 8:00-7:00, Fri 8:00-5:00, Sat 8:00-12:00 Babies seen by providers at [facility] Accepting Medicaid - but does not have vaccinations in office (must be received elsewhere) Limited availability, please call early in hospitalization  [place] ([address]) [facility]  Charlene Flemming, MD [address], [place] [address] [phone]  Fax [phone]  [facility]  Kimberly Newton, MD, Annamarie Streilein, PA, Carla Hampton, PA [address] [place], [place]  [address] [phone] [facility]  [address], [place], [place] [address] [phone] [address], [place], [place] [address] [phone] (West Office)  [facility] [address], [place], [place] [address] [phone] [facility] [address], [place], [place] [address] [phone] [facility] [address], [place], [place] [address] [phone] [facility] [address], [place], [place] [address] [phone] [facility] [address], [place], [place] [address] [phone] [facility] [address], [place], [place] [address] [phone] [facility]  [address], [place], [place] [address] [phone] Dr[facility] [address] [place] [address], [place], [place] [address] [phone] [facility] [address], [place], [place] [address] [phone] [facility] [address], [place],  [address] [phone]   At our [facility], we work as an integrated team, providing care to address both physical and emotional health. Your medical provider may refer you to see our Behavioral Health Clinician (BHC) on the same day you see your medical provider, as availability permits; often scheduled virtually at your convenience.  Our BHC is available to all patients, visits generally last between 20-30 minutes, but can be longer or shorter, depending on patient need. The BHC offers help with stress management, coping with symptoms of depression and anxiety, major life changes , sleep issues, changing risky behavior, grief and loss, life stress, working on personal life goals, and  behavioral health issues, as these all affect your overall health and wellness.  The BHC is NOT available for the following: FMLA paperwork, court-ordered evaluations, specialty assessments (custody or disability), letters to employers, or  obtaining certification for an emotional support animal. The BHC does not provide long-term therapy. You have the right to refuse integrated behavioral health services, or to reschedule to see the BHC at a later date.  Confidentiality exception: If it is suspected that a child or disabled adult is being abused or neglected, we are required by law to report that to either Child Protective Services or Adult Protective Services.  If you have a diagnosis of Bipolar affective disorder, Schizophrenia, or recurrent Major depressive disorder, we will recommend that you establish care with a psychiatrist, as these are lifelong, chronic conditions, and we want your overall emotional health and medications to be more closely monitored. If you anticipate needing extended maternity leave due to mental health issues postpartum, it it recommended you inform your medical provider, so we can put in a referral to a psychiatrist as soon as possible. The BHC is unable to recommend an extended maternity leave for mental health issues. Your medical provider   or BHC may refer you to a therapist for ongoing, traditional therapy, or to a psychiatrist, for medication management, if it would benefit your overall health. Depending on your insurance, you may have a copay or be charged a deductible, depending on your insurance, to see the BHC. If you are uninsured, it is recommended that you apply for financial assistance. (Forms may be requested at the front desk for in-person visits, via MyChart, or request a form during a virtual visit).  If you see the BHC more than 6 times, you will have to complete a comprehensive clinical assessment interview with the BHC to resume integrated services.  For virtual visits with the BHC, you must be physically in the state of [place] at the time of the visit. For example, if you live in [place], you will have to do an in-person visit with the BHC, and your out-of-state insurance may not cover  behavioral health services in [place]. If you are going out of the state or country for any reason, the BHC may see you virtually when you return to [place], but not while you are physically outside of [place].   

## 2021-06-04 DIAGNOSIS — Z3400 Encounter for supervision of normal first pregnancy, unspecified trimester: Secondary | ICD-10-CM

## 2021-06-04 NOTE — Patient Instructions (Addendum)
Safe Medications in Pregnancy   Acne:  Benzoyl Peroxide  Salicylic Acid   Backache/Headache:  Tylenol: 2 regular strength every 4 hours OR               2 Extra strength every 6 hours   Colds/Coughs/Allergies:  Benadryl (alcohol free) 25 mg every 6 hours as needed  Breath right strips  Claritin  Cepacol throat lozenges  Chloraseptic throat spray  Cold-Eeze- up to three times per day  Cough drops, alcohol free  Flonase (by prescription only)  Guaifenesin  Mucinex  Robitussin DM (plain only, alcohol free)  Saline nasal spray/drops  Sudafed (pseudoephedrine) & Actifed * use only after [redacted] weeks gestation and if you do not have high blood pressure  Tylenol  Vicks Vaporub  Zinc lozenges  Zyrtec   Constipation:  Colace  Ducolax suppositories  Fleet enema  Glycerin suppositories  Metamucil  Milk of magnesia  Miralax  Senokot  Smooth move tea   Diarrhea:  Kaopectate  Imodium A-D   *NO pepto Bismol   Hemorrhoids:  Anusol  Anusol HC  Preparation H  Tucks   Indigestion:  Tums  Maalox  Mylanta  Zantac  Pepcid   Insomnia:  Benadryl (alcohol free) 25mg  every 6 hours as needed  Tylenol PM  Unisom, no Gelcaps   Leg Cramps:  Tums  MagGel   Nausea/Vomiting:  Bonine  Dramamine  Emetrol  Ginger extract  Sea bands  Meclizine  Nausea medication to take during pregnancy:  Unisom (doxylamine succinate 25 mg tablets) Take one tablet daily at bedtime. If symptoms are not adequately controlled, the dose can be increased to a maximum recommended dose of two tablets daily (1/2 tablet in the morning, 1/2 tablet mid-afternoon and one at bedtime).  Vitamin B6 100mg  tablets. Take one tablet twice a day (up to 200 mg per day).   Skin Rashes:  Aveeno products  Benadryl cream or 25mg  every 6 hours as needed  Calamine Lotion  1% cortisone cream   Yeast infection:  Gyne-lotrimin 7  Monistat 7    **If taking multiple medications, please check labels to avoid  duplicating the same active ingredients  **take medication as directed on the label  ** Do not exceed 4000 mg of tylenol in 24 hours  **Do not take medications that contain aspirin or ibuprofen           Second Trimester of Pregnancy The second trimester of pregnancy is from week 13 through week 27. This is months 4 through 6 of pregnancy. The second trimester is often a time when you feel your best. Your body has adjusted to being pregnant, and you begin to feel better physically. During the second trimester: Morning sickness has lessened or stopped completely. You may have more energy. You may have an increase in appetite. The second trimester is also a time when the unborn baby (fetus) is growing rapidly. At the end of the sixth month, the fetus may be up to 12 inches long and weigh about 1 pounds. You will likely begin to feel the baby move (quickening) between 16 and 20 weeks of pregnancy. Body changes during your second trimester Your body continues to go through many changes during your second trimester. The changes vary and generally return to normal after the baby is born. Physical changes Your weight will continue to increase. You will notice your lower abdomen bulging out. You may begin to get stretch marks on your hips, abdomen, and breasts. Your breasts will continue  to grow and to become tender. Dark spots or blotches (chloasma or mask of pregnancy) may develop on your face. A dark line from your belly button to the pubic area (linea nigra) may appear. You may have changes in your hair. These can include thickening of your hair, rapid growth, and changes in texture. Some people also have hair loss during or after pregnancy, or hair that feels dry or thin. Health changes You may develop headaches. You may have heartburn. You may develop constipation. You may develop hemorrhoids or swollen, bulging veins (varicose veins). Your gums may bleed and may be sensitive to brushing  and flossing. You may urinate more often because the fetus is pressing on your bladder. You may have back pain. This is caused by: Weight gain. Pregnancy hormones that are relaxing the joints in your pelvis. A shift in weight and the muscles that support your balance. Follow these instructions at home: Medicines Follow your health care provider's instructions regarding medicine use. Specific medicines may be either safe or unsafe to take during pregnancy. Do not take any medicines unless approved by your health care provider. Take a prenatal vitamin that contains at least 600 micrograms (mcg) of folic acid. Eating and drinking Eat a healthy diet that includes fresh fruits and vegetables, whole grains, good sources of protein such as meat, eggs, or tofu, and low-fat dairy products. Avoid raw meat and unpasteurized juice, milk, and cheese. These carry germs that can harm you and your baby. You may need to take these actions to prevent or treat constipation: Drink enough fluid to keep your urine pale yellow. Eat foods that are high in fiber, such as beans, whole grains, and fresh fruits and vegetables. Limit foods that are high in fat and processed sugars, such as fried or sweet foods. Activity Exercise only as directed by your health care provider. Most people can continue their usual exercise routine during pregnancy. Try to exercise for 30 minutes at least 5 days a week. Stop exercising if you develop contractions in your uterus. Stop exercising if you develop pain or cramping in the lower abdomen or lower back. Avoid exercising if it is very hot or humid or if you are at a high altitude. Avoid heavy lifting. If you choose to, you may have sex unless your health care provider tells you not to. Relieving pain and discomfort Wear a supportive bra to prevent discomfort from breast tenderness. Take warm sitz baths to soothe any pain or discomfort caused by hemorrhoids. Use hemorrhoid cream if  your health care provider approves. Rest with your legs raised (elevated) if you have leg cramps or low back pain. If you develop varicose veins: Wear support hose as told by your health care provider. Elevate your feet for 15 minutes, 3-4 times a day. Limit salt in your diet. Safety Wear your seat belt at all times when driving or riding in a car. Talk with your health care provider if someone is verbally or physically abusive to you. Lifestyle Do not use hot tubs, steam rooms, or saunas. Do not douche. Do not use tampons or scented sanitary pads. Avoid cat litter boxes and soil used by cats. These carry germs that can cause birth defects in the baby and possibly loss of the fetus by miscarriage or stillbirth. Do not use herbal remedies, alcohol, illegal drugs, or medicines that are not approved by your health care provider. Chemicals in these products can harm your baby. Do not use any products that contain nicotine  or tobacco, such as cigarettes, e-cigarettes, and chewing tobacco. If you need help quitting, ask your health care provider. General instructions During a routine prenatal visit, your health care provider will do a physical exam and other tests. He or she will also discuss your overall health. Keep all follow-up visits. This is important. Ask your health care provider for a referral to a local prenatal education class. Ask for help if you have counseling or nutritional needs during pregnancy. Your health care provider can offer advice or refer you to specialists for help with various needs. Where to find more information American Pregnancy Association: americanpregnancy.org [employer]: https://www.todd-brady.net/acog.org/en/Womens%20Health/Pregnancy Office on [employer]'s Health: MightyReward.co.nzwomenshealth.gov/pregnancy Contact a health care provider if you have: A headache that does not go away when you take medicine. Vision changes or you see spots in front of your eyes. Mild  pelvic cramps, pelvic pressure, or nagging pain in the abdominal area. Persistent nausea, vomiting, or diarrhea. A bad-smelling vaginal discharge or foul-smelling urine. Pain when you urinate. Sudden or extreme swelling of your face, hands, ankles, feet, or legs. A fever. Get help right away if you: Have fluid leaking from your vagina. Have spotting or bleeding from your vagina. Have severe abdominal cramping or pain. Have difficulty breathing. Have chest pain. Have fainting spells. Have not felt your baby move for the time period told by your health care provider. Have new or increased pain, swelling, or redness in an arm or leg. Summary The second trimester of pregnancy is from week 13 through week 27 (months 4 through 6). Do not use herbal remedies, alcohol, illegal drugs, or medicines that are not approved by your health care provider. Chemicals in these products can harm your baby. Exercise only as directed by your health care provider. Most people can continue their usual exercise routine during pregnancy. Keep all follow-up visits. This is important. This information is not intended to replace advice given to you by your health care provider. Make sure you discuss any questions you have with your health care provider. Document Revised: 09/04/2019 Document Reviewed: 07/11/2019 Elsevier Patient Education  2022 [employer]  Contraception Choices Contraception, also called birth control, refers to methods or devices that prevent pregnancy. Hormonal methods Contraceptive implant A contraceptive implant is a thin, plastic tube that contains a hormone that prevents pregnancy. It is different from an intrauterine device (IUD). It is inserted into the upper part of the arm by a health care provider. Implants can be effective for up to 3 years. Progestin-only injections Progestin-only injections are injections of progestin, a synthetic form of the hormone progesterone. They are given  every 3 months by a health care provider. Birth control pills Birth control pills are pills that contain hormones that prevent pregnancy. They must be taken once a day, preferably at the same time each day. A prescription is needed to use this method of contraception. Birth control patch The birth control patch contains hormones that prevent pregnancy. It is placed on the skin and must be changed once a week for three weeks and removed on the fourth week. A prescription is needed to use this method of contraception. Vaginal ring A vaginal ring contains hormones that prevent pregnancy. It is placed in the vagina for three weeks and removed on the fourth week. After that, the process is repeated with a new ring. A prescription is needed to use this method of contraception. Emergency contraceptive Emergency contraceptives prevent pregnancy after unprotected sex. They come in pill  form and can be taken up to 5 days after sex. They work best the sooner they are taken after having sex. Most emergency contraceptives are available without a prescription. This method should not be used as your only form of birth control. Barrier methods Female condom A female condom is a thin sheath that is worn over the penis during sex. Condoms keep sperm from going inside a woman's body. They can be used with a sperm-killing substance (spermicide) to increase their effectiveness. They should be thrown away after one use. Female condom A female condom is a soft, loose-fitting sheath that is put into the vagina before sex. The condom keeps sperm from going inside a woman's body. They should be thrown away after one use. Diaphragm A diaphragm is a soft, dome-shaped barrier. It is inserted into the vagina before sex, along with a spermicide. The diaphragm blocks sperm from entering the uterus, and the spermicide kills sperm. A diaphragm should be left in the vagina for 6-8 hours after sex and removed within 24 hours. A diaphragm  is prescribed and fitted by a health care provider. A diaphragm should be replaced every 1-2 years, after giving birth, after gaining more than 15 lb (6.8 kg), and after pelvic surgery. Cervical cap A cervical cap is a round, soft latex or plastic cup that fits over the cervix. It is inserted into the vagina before sex, along with spermicide. It blocks sperm from entering the uterus. The cap should be left in place for 6-8 hours after sex and removed within 48 hours. A cervical cap must be prescribed and fitted by a health care provider. It should be replaced every 2 years. Sponge A sponge is a soft, circular piece of polyurethane foam with spermicide in it. The sponge helps block sperm from entering the uterus, and the spermicide kills sperm. To use it, you make it wet and then insert it into the vagina. It should be inserted before sex, left in for at least 6 hours after sex, and removed and thrown away within 30 hours. Spermicides Spermicides are chemicals that kill or block sperm from entering the cervix and uterus. They can come as a cream, jelly, suppository, foam, or tablet. A spermicide should be inserted into the vagina with an applicator at least 10-15 minutes before sex to allow time for it to work. The process must be repeated every time you have sex. Spermicides do not require a prescription. Intrauterine contraception Intrauterine device (IUD) An IUD is a T-shaped device that is put in a woman's uterus. There are two types: Hormone IUD.This type contains progestin, a synthetic form of the hormone progesterone. This type can stay in place for 3-5 years. Copper IUD.This type is wrapped in copper wire. It can stay in place for 10 years. Permanent methods of contraception Female tubal ligation In this method, a woman's fallopian tubes are sealed, tied, or blocked during surgery to prevent eggs from traveling to the uterus. Hysteroscopic sterilization In this method, a small, flexible insert  is placed into each fallopian tube. The inserts cause scar tissue to form in the fallopian tubes and block them, so sperm cannot reach an egg. The procedure takes about 3 months to be effective. Another form of birth control must be used during those 3 months. Female sterilization This is a procedure to tie off the tubes that carry sperm (vasectomy). After the procedure, the man can still ejaculate fluid (semen). Another form of birth control must be used for 3 months  after the procedure. Natural planning methods Natural family planning In this method, a couple does not have sex on days when the woman could become pregnant. Calendar method In this method, the woman keeps track of the length of each menstrual cycle, identifies the days when pregnancy can happen, and does not have sex on those days. Ovulation method In this method, a couple avoids sex during ovulation. Symptothermal method This method involves not having sex during ovulation. The woman typically checks for ovulation by watching changes in her temperature and in the consistency of cervical mucus. Post-ovulation method In this method, a couple waits to have sex until after ovulation. Where to find more information Centers for Disease Control and Prevention: FootballExhibition.com.br Summary Contraception, also called birth control, refers to methods or devices that prevent pregnancy. Hormonal methods of contraception include implants, injections, pills, patches, vaginal rings, and emergency contraceptives. Barrier methods of contraception can include female condoms, female condoms, diaphragms, cervical caps, sponges, and spermicides. There are two types of IUDs (intrauterine devices). An IUD can be put in a woman's uterus to prevent pregnancy for 3-5 years. Permanent sterilization can be done through a procedure for males and females. Natural family planning methods involve nothaving sex on days when the woman could become pregnant. This information  is not intended to replace advice given to you by your health care provider. Make sure you discuss any questions you have with your health care provider. Document Revised: 09/02/2019 Document Reviewed: 09/02/2019 Elsevier Patient Education  2022 [employer].

## 2021-06-04 NOTE — Progress Notes (Signed)
° °  ° °  Subjective:   Leslie Fox is a 26 y.o. G1P0 at [redacted]w[redacted]d by 8wk [place] being seen today for her first obstetrical visit.  Her obstetrical history is significant for  n/a . Patient does intend to breast feed. Pregnancy history fully reviewed.  Patient reports no complaints.  HISTORY: OB History  Gravida Para Term Preterm AB Living  1 0 0 0 0 0  SAB IAB Ectopic Multiple Live Births  0 0 0 0 0    # Outcome Date GA Lbr Len/2nd Weight Sex Delivery Anes PTL Lv  1 Current              Last pap smear: No results found for: DIAGPAP, HPV, HPVHIGH *needs*  History reviewed. No pertinent past medical history. Past Surgical History:  Procedure Laterality Date   BREAST ENHANCEMENT SURGERY     BREAST SURGERY     EYE SURGERY     Family History  Problem Relation Age of Onset   Hypertension Mother    Social History   Tobacco Use   Smoking status: Never   Smokeless tobacco: Never  Substance Use Topics   Alcohol use: No   Drug use: No   No Known Allergies Current Outpatient Medications on File Prior to Visit  Medication Sig Dispense Refill   Doxylamine-Pyridoxine 10-10 MG TBEC Take 2 tablets by mouth at bedtime. 60 tablet 1   Prenatal Vit-Fe Fumarate-FA (MULTIVITAMIN-PRENATAL) 27-0.8 MG TABS tablet Take 1 tablet by mouth daily at 12 noon.     No current facility-administered medications on file prior to visit.     Exam   Vitals:   06/04/21 0933  BP: 120/80  Pulse: 90  Weight: 114 lb 12.8 oz (52.1 kg)   Fetal Heart Rate (bpm): 155  System: General: well-developed, well-nourished female in no acute distress   Skin: normal coloration and turgor, no rashes   Neurologic: oriented, normal, negative, normal mood   Extremities: normal strength, tone, and muscle mass, ROM of all joints is normal   HEENT PERRLA, extraocular movement intact and sclera clear, anicteric   Neck supple and no masses   Respiratory:  no respiratory distress      Assessment:   Pregnancy:  G1P0 Patient Active Problem List   Diagnosis Date Noted   Supervision of normal first pregnancy, antepartum 05/18/2021     Plan:  1. Supervision of other normal pregnancy, antepartum BP and FHR normal Pap deferred as she needs to go apply for Medicaid at this time Many excellent questions about appropriate foods in pregnancy, heartburn symptoms and how to treat them, exercise in pregnancy, and appropriate medications in pregnancy Initial labs drawn. Continue prenatal vitamins. Genetic Screening discussed, NIPS: ordered. Ultrasound discussed; fetal anatomic survey: ordered. Problem list reviewed and updated. The nature of [facility] was explained to patient; Voiced they may need to be seen by other [facility] providers which includes family medicine physicians, OB GYNs, and APPs. Delivery will hopefully be with one of the [facility] providers or another [facility] physician and we cannot promise this at this time.  Discussed there are [facility] staff in the hospital 24-7 and they understand and support this model and there is a likelihood one of these providers will catch their baby.  We also discussed that the service includes learners (residents, student) and they will be involved in the care team.  Routine obstetric precautions reviewed. Return in 4 weeks (on 07/02/2021) for [facility] patient, ob visit.

## 2021-07-01 DIAGNOSIS — O26892 Other specified pregnancy related conditions, second trimester: Secondary | ICD-10-CM

## 2021-07-01 DIAGNOSIS — R519 Headache, unspecified: Secondary | ICD-10-CM

## 2021-07-01 DIAGNOSIS — Z3400 Encounter for supervision of normal first pregnancy, unspecified trimester: Secondary | ICD-10-CM

## 2021-07-01 NOTE — Progress Notes (Signed)
? ? ?  PRENATAL VISIT NOTE ? ?Subjective:  ?Leslie Fox is a 26 y.o. G1P0 at [redacted]w[redacted]d being seen today for ongoing prenatal care.  She is currently monitored for the following issues for this low-risk pregnancy and has Supervision of normal first pregnancy, antepartum on their problem list. ? ?Patient reports headache.  Contractions: Not present. Vag. Bleeding: None.  Movement: Absent. Denies leaking of fluid.  ? ?The following portions of the patient's history were reviewed and updated as appropriate: allergies, current medications, past family history, past medical history, past social history, past surgical history and problem list.  ? ?Objective:  ? ?Vitals:  ? 07/01/21 1045  ?BP: 110/65  ?Pulse: (!) 108  ?Weight: 116 lb 3.2 oz (52.7 kg)  ? ? ?Fetal Status: Fetal Heart Rate (bpm): 152   Movement: Absent    ? ?General:  Alert, oriented and cooperative. Patient is in no acute distress.  ?Skin: Skin is warm and dry. No rash noted.   ?Cardiovascular: Normal heart rate noted  ?Respiratory: Normal respiratory effort, no problems with respiration noted  ?Abdomen: Soft, gravid, appropriate for gestational age.  Pain/Pressure: Present     ?Pelvic: Cervical exam deferred        ?Extremities: Normal range of motion.  Edema: None  ?Mental Status: Normal mood and affect. Normal behavior. Normal judgment and thought content.  ? ?Assessment and Plan:  ?Pregnancy: G1P0 at [redacted]w[redacted]d ?1. Supervision of normal first pregnancy, antepartum ?Up to date ?Reviewed normal cadence of visots ?- AFP, Serum, Open Spina Bifida ? ?2. Pregnancy headache in second trimester ?Recommended tylenol ?Increase water intake ?Rx for flexeril to help with HA and sleep ?- cyclobenzaprine (FLEXERIL) 10 MG tablet; Take 1 tablet (10 mg total) by mouth every 8 (eight) hours as needed for muscle spasms.  Dispense: 30 tablet; Refill: 1 ? ?Preterm labor symptoms and general obstetric precautions including but not limited to vaginal bleeding, contractions, leaking  of fluid and fetal movement were reviewed in detail with the patient. ?Please refer to After Visit Summary for other counseling recommendations.  ? ?Return in about 4 weeks (around 07/29/2021) for [facility]+Baby Combined Care. ? ?Future Appointments  ?Date Time Provider [place]  ?07/29/2021 10:30 AM [facility] US[facility]  ?07/30/2021 10:35 AM Dione Plover, Annice Needy, MD [facility]  ? ? ?Caren Macadam, MD ? ?

## 2021-07-01 NOTE — Progress Notes (Signed)
Patient in for routine prenatal visit, states that she has been experiencing headaches that occur daily. States that she has not been taking tylenol unless headaches gets unbearable. Has not been sleeping well at night, reports very little water intake, has been drinking apple juice, sweet tea, Body Armor, and occasionally sodas.  ? ? ? ?Wynona Canes, CMA ?

## 2021-07-29 DIAGNOSIS — Z3482 Encounter for supervision of other normal pregnancy, second trimester: Secondary | ICD-10-CM | POA: Insufficient documentation

## 2021-07-29 DIAGNOSIS — Z3689 Encounter for other specified antenatal screening: Secondary | ICD-10-CM

## 2021-07-29 DIAGNOSIS — Z363 Encounter for antenatal screening for malformations: Secondary | ICD-10-CM | POA: Insufficient documentation

## 2021-07-29 DIAGNOSIS — Z3480 Encounter for supervision of other normal pregnancy, unspecified trimester: Secondary | ICD-10-CM

## 2021-07-30 DIAGNOSIS — Z3A19 19 weeks gestation of pregnancy: Secondary | ICD-10-CM

## 2021-07-30 DIAGNOSIS — Z3400 Encounter for supervision of normal first pregnancy, unspecified trimester: Secondary | ICD-10-CM

## 2021-07-30 NOTE — Progress Notes (Signed)
? ?  Subjective:  ?Leslie Fox is a 26 y.o. G1P0 at [redacted]w[redacted]d being seen today for ongoing prenatal care.  She is currently monitored for the following issues for this low-risk pregnancy and has Supervision of normal first pregnancy, antepartum on their problem list. ? ?Patient reports no complaints.  Contractions: Not present. Vag. Bleeding: None.  Movement: Present. Denies leaking of fluid.  ? ?The following portions of the patient's history were reviewed and updated as appropriate: allergies, current medications, past family history, past medical history, past social history, past surgical history and problem list. Problem list updated. ? ?Objective:  ? ?Vitals:  ? 07/30/21 1053  ?BP: 109/70  ?Pulse: 93  ?Weight: 122 lb 11.2 oz (55.7 kg)  ? ? ?Fetal Status: Fetal Heart Rate (bpm): 145   Movement: Present    ? ?General:  Alert, oriented and cooperative. Patient is in no acute distress.  ?Skin: Skin is warm and dry. No rash noted.   ?Cardiovascular: Normal heart rate noted  ?Respiratory: Normal respiratory effort, no problems with respiration noted  ?Abdomen: Soft, gravid, appropriate for gestational age. Pain/Pressure: Present     ?Pelvic: Vag. Bleeding: None     ?Cervical exam deferred        ?Extremities: Normal range of motion.  Edema: None  ?Mental Status: Normal mood and affect. Normal behavior. Normal judgment and thought content.  ? ?Urinalysis:     ? ?Assessment and Plan:  ?Pregnancy: G1P0 at [redacted]w[redacted]d ? ?1. Supervision of normal first pregnancy, antepartum ?BP and FHR normal ?Discussed OK to drink electrolyte drinks in pregnancy, get massages ?Going to [place] next month, will only be 23 weeks, discussed this is fine ? ?Preterm labor symptoms and general obstetric precautions including but not limited to vaginal bleeding, contractions, leaking of fluid and fetal movement were reviewed in detail with the patient. ?Please refer to After Visit Summary for other counseling recommendations.  ?Return in 4 weeks (on  08/27/2021) for Dyad patient, ob visit. ? ? ?Venora Maples, MD ? ?

## 2021-07-30 NOTE — Patient Instructions (Signed)

## 2021-08-27 DIAGNOSIS — Z3400 Encounter for supervision of normal first pregnancy, unspecified trimester: Secondary | ICD-10-CM

## 2021-08-27 NOTE — Progress Notes (Signed)
   Subjective:  Leslie Fox is a 26 y.o. G1P0 at [redacted]w[redacted]d being seen today for ongoing prenatal care.  She is currently monitored for the following issues for this low-risk pregnancy and has Supervision of normal first pregnancy, antepartum on their problem list.  Patient reports no complaints.  Contractions: Not present. Vag. Bleeding: None.  Movement: Present. Denies leaking of fluid.   The following portions of the patient's history were reviewed and updated as appropriate: allergies, current medications, past family history, past medical history, past social history, past surgical history and problem list. Problem list updated.  Objective:   Vitals:   08/27/21 1126  BP: 104/72  Pulse: 90  Weight: 131 lb 9.6 oz (59.7 kg)    Fetal Status: Fetal Heart Rate (bpm): 146   Movement: Present     General:  Alert, oriented and cooperative. Patient is in no acute distress.  Skin: Skin is warm and dry. No rash noted.   Cardiovascular: Normal heart rate noted  Respiratory: Normal respiratory effort, no problems with respiration noted  Abdomen: Soft, gravid, appropriate for gestational age. Pain/Pressure: Present     Pelvic: Vag. Bleeding: None     Cervical exam deferred        Extremities: Normal range of motion.  Edema: None  Mental Status: Normal mood and affect. Normal behavior. Normal judgment and thought content.   Urinalysis:      Assessment and Plan:  Pregnancy: G1P0 at [redacted]w[redacted]d  1. Supervision of normal first pregnancy, antepartum BP and FHR normal Discussed fasting labs for next visit Skipped some weeks of prenatals, reassured her its fine Some heartburn, discussed tums, pepcid Going to [facility] tomorrow  Preterm labor symptoms and general obstetric precautions including but not limited to vaginal bleeding, contractions, leaking of fluid and fetal movement were reviewed in detail with the patient. Please refer to After Visit Summary for other counseling recommendations.  Return  in about 4 weeks (around 09/24/2021) for Dyad patient, ob visit, 28 wk labs.   Venora Maples, MD

## 2021-09-02 DIAGNOSIS — Z3A24 24 weeks gestation of pregnancy: Secondary | ICD-10-CM

## 2021-09-02 DIAGNOSIS — O35BXX0 Maternal care for other (suspected) fetal abnormality and damage, fetal cardiac anomalies, not applicable or unspecified: Secondary | ICD-10-CM

## 2021-09-02 DIAGNOSIS — O359XX0 Maternal care for (suspected) fetal abnormality and damage, unspecified, not applicable or unspecified: Secondary | ICD-10-CM

## 2021-09-02 DIAGNOSIS — O283 Abnormal ultrasonic finding on antenatal screening of mother: Secondary | ICD-10-CM

## 2021-09-02 DIAGNOSIS — Z3689 Encounter for other specified antenatal screening: Secondary | ICD-10-CM | POA: Insufficient documentation

## 2021-09-10 NOTE — Telephone Encounter (Signed)
Patient called to let us know it is okay to go ahead and fax the Fetal Echo form to [facility] to get an appointment scheduled.

## 2021-09-20 DIAGNOSIS — Z3480 Encounter for supervision of other normal pregnancy, unspecified trimester: Secondary | ICD-10-CM

## 2021-09-23 DIAGNOSIS — Z3400 Encounter for supervision of normal first pregnancy, unspecified trimester: Secondary | ICD-10-CM

## 2021-09-23 DIAGNOSIS — K219 Gastro-esophageal reflux disease without esophagitis: Secondary | ICD-10-CM

## 2021-09-23 DIAGNOSIS — Z23 Encounter for immunization: Secondary | ICD-10-CM

## 2021-09-23 DIAGNOSIS — O99613 Diseases of the digestive system complicating pregnancy, third trimester: Secondary | ICD-10-CM

## 2021-09-23 DIAGNOSIS — Z9882 Breast implant status: Secondary | ICD-10-CM | POA: Insufficient documentation

## 2021-09-23 NOTE — Patient Instructions (Signed)

## 2021-09-23 NOTE — Progress Notes (Signed)
   PRENATAL VISIT NOTE  Subjective:  Leslie Fox is a 26 y.o. G1P0 at [redacted]w[redacted]d being seen today for ongoing prenatal care.  She is currently monitored for the following issues for this low-risk pregnancy and has Supervision of normal first pregnancy, antepartum and History of elective breast augmentation on their problem list.  Patient reports no complaints.  Contractions: Not present. Vag. Bleeding: None.  Movement: Present. Denies leaking of fluid.   The following portions of the patient's history were reviewed and updated as appropriate: allergies, current medications, past family history, past medical history, past social history, past surgical history and problem list.   Objective:   Vitals:   09/23/21 0831  BP: 104/72  Pulse: (!) 125  Weight: 137 lb 4.8 oz (62.3 kg)    Fetal Status: Fetal Heart Rate (bpm): 142 Fundal Height: 28 cm Movement: Present     General:  Alert, oriented and cooperative. Patient is in no acute distress.  Skin: Skin is warm and dry. No rash noted.   Cardiovascular: Normal heart rate noted  Respiratory: Normal respiratory effort, no problems with respiration noted  Abdomen: Soft, gravid, appropriate for gestational age.  Pain/Pressure: Absent     Pelvic: Cervical exam deferred        Extremities: Normal range of motion.  Edema: None  Mental Status: Normal mood and affect. Normal behavior. Normal judgment and thought content.   Assessment and Plan:  Pregnancy: G1P0 at [redacted]w[redacted]d  1. Supervision of normal first pregnancy, antepartum 28 wk lab today Doing well and no concerns Reviewed use of pepcid and zantac for GERD Has breast augmentation, has felt breast growth in pregnancy  Preterm labor symptoms and general obstetric precautions including but not limited to vaginal bleeding, contractions, leaking of fluid and fetal movement were reviewed in detail with the patient. Please refer to After Visit Summary for other counseling recommendations.   Return  in about 2 weeks (around 10/07/2021) for Routine prenatal care.  Future Appointments  Date Time Provider Department Center  09/30/2021  7:15 AM [facility] NURSE [facility]  09/30/2021  7:30 AM [facility] US3 [facility]  10/06/2021  9:15 AM Venora Maples, MD [facility]  10/18/2021  3:55 PM Reva Bores, MD [facility]  11/01/2021  8:35 AM Reva Bores, MD [facility]    Federico Flake, MD

## 2021-09-25 DIAGNOSIS — O99013 Anemia complicating pregnancy, third trimester: Secondary | ICD-10-CM

## 2021-09-30 DIAGNOSIS — Z3689 Encounter for other specified antenatal screening: Secondary | ICD-10-CM

## 2021-09-30 DIAGNOSIS — Z3A28 28 weeks gestation of pregnancy: Secondary | ICD-10-CM

## 2021-09-30 DIAGNOSIS — O283 Abnormal ultrasonic finding on antenatal screening of mother: Secondary | ICD-10-CM

## 2021-09-30 DIAGNOSIS — Z362 Encounter for other antenatal screening follow-up: Secondary | ICD-10-CM

## 2021-10-06 DIAGNOSIS — Z3400 Encounter for supervision of normal first pregnancy, unspecified trimester: Secondary | ICD-10-CM

## 2021-10-06 DIAGNOSIS — O99619 Diseases of the digestive system complicating pregnancy, unspecified trimester: Secondary | ICD-10-CM

## 2021-10-06 DIAGNOSIS — Z9882 Breast implant status: Secondary | ICD-10-CM

## 2021-10-06 DIAGNOSIS — K219 Gastro-esophageal reflux disease without esophagitis: Secondary | ICD-10-CM

## 2021-10-06 DIAGNOSIS — O99013 Anemia complicating pregnancy, third trimester: Secondary | ICD-10-CM

## 2021-10-06 NOTE — Progress Notes (Signed)
   Subjective:  Leslie Fox is a 26 y.o. G1P0 at [redacted]w[redacted]d being seen today for ongoing prenatal care.  She is currently monitored for the following issues for this low-risk pregnancy and has Supervision of normal first pregnancy, antepartum; History of elective breast augmentation; and Anemia affecting pregnancy in third trimester on their problem list.  Patient reports no complaints.  Contractions: Not present. Vag. Bleeding: None.  Movement: Present. Denies leaking of fluid.   The following portions of the patient's history were reviewed and updated as appropriate: allergies, current medications, past family history, past medical history, past social history, past surgical history and problem list. Problem list updated.  Objective:   Vitals:   10/06/21 0942  BP: 102/63  Pulse: (!) 106  Weight: 140 lb 12.8 oz (63.9 kg)    Fetal Status: Fetal Heart Rate (bpm): 158   Movement: Present     General:  Alert, oriented and cooperative. Patient is in no acute distress.  Skin: Skin is warm and dry. No rash noted.   Cardiovascular: Normal heart rate noted  Respiratory: Normal respiratory effort, no problems with respiration noted  Abdomen: Soft, gravid, appropriate for gestational age. Pain/Pressure: Absent     Pelvic: Vag. Bleeding: None     Cervical exam deferred        Extremities: Normal range of motion.     Mental Status: Normal mood and affect. Normal behavior. Normal judgment and thought content.   Urinalysis:      Assessment and Plan:  Pregnancy: G1P0 at [redacted]w[redacted]d  1. Supervision of normal first pregnancy, antepartum BP and FHR normal Trial protonix for worsening GERD symptoms Discussed contraception, leaning towards POPs+condoms  2. Anemia affecting pregnancy in third trimester On PO iron Lab Results  Component Value Date   HGB 9.9 (L) 09/23/2021    3. History of elective breast augmentation Plan for lactation consult pp  Preterm labor symptoms and general obstetric  precautions including but not limited to vaginal bleeding, contractions, leaking of fluid and fetal movement were reviewed in detail with the patient. Please refer to After Visit Summary for other counseling recommendations.  Return in 2 weeks (on 10/20/2021) for Dyad patient, ob visit.   Venora Maples, MD

## 2021-10-06 NOTE — Patient Instructions (Signed)

## 2021-10-18 DIAGNOSIS — Z3400 Encounter for supervision of normal first pregnancy, unspecified trimester: Secondary | ICD-10-CM

## 2021-10-18 NOTE — Progress Notes (Signed)
   PRENATAL VISIT NOTE  Subjective:  Leslie Fox is a 26 y.o. G1P0 at [redacted]w[redacted]d being seen today for ongoing prenatal care.  She is currently monitored for the following issues for this low-risk pregnancy and has Supervision of normal first pregnancy, antepartum; History of elective breast augmentation; Anemia affecting pregnancy in third trimester; and Allergic rhinitis on their problem list.  Patient reports no complaints.  Contractions: Not present. Vag. Bleeding: None.  Movement: Present. Denies leaking of fluid.   The following portions of the patient's history were reviewed and updated as appropriate: allergies, current medications, past family history, past medical history, past social history, past surgical history and problem list.   Objective:   Vitals:   10/18/21 1601  BP: 115/72  Pulse: (!) 101  Weight: 144 lb 9.6 oz (65.6 kg)    Fetal Status: Fetal Heart Rate (bpm): 151 Fundal Height: 30 cm Movement: Present     General:  Alert, oriented and cooperative. Patient is in no acute distress.  Skin: Skin is warm and dry. No rash noted.   Cardiovascular: Normal heart rate noted  Respiratory: Normal respiratory effort, no problems with respiration noted  Abdomen: Soft, gravid, appropriate for gestational age.  Pain/Pressure: Absent     Pelvic: Cervical exam deferred        Extremities: Normal range of motion.  Edema: None  Mental Status: Normal mood and affect. Normal behavior. Normal judgment and thought content.   Assessment and Plan:  Pregnancy: G1P0 at [redacted]w[redacted]d 1. Supervision of normal first pregnancy, antepartum Continue routine prenatal care. Discomforts of pregnancy reviewed including sciatica and dizziness discussed hydration, position  Preterm labor symptoms and general obstetric precautions including but not limited to vaginal bleeding, contractions, leaking of fluid and fetal movement were reviewed in detail with the patient. Please refer to After Visit Summary for  other counseling recommendations.   Return in 2 weeks (on 11/01/2021).  Future Appointments  Date Time Provider Department Center  11/01/2021  8:35 AM Reva Bores, MD [facility]  11/16/2021  3:15 PM Federico Flake, MD [facility]  11/25/2021  9:15 AM [facility] NURSE [facility]  11/25/2021  9:30 AM [facility] US3 [facility]  11/29/2021  9:35 AM Federico Flake, MD [facility]  12/06/2021 10:35 AM Federico Flake, MD [facility]    Reva Bores, MD

## 2021-11-01 DIAGNOSIS — Z3400 Encounter for supervision of normal first pregnancy, unspecified trimester: Secondary | ICD-10-CM

## 2021-11-01 NOTE — Progress Notes (Signed)
   PRENATAL VISIT NOTE  Subjective:  Leslie Fox is a 26 y.o. G1P0 at [redacted]w[redacted]d being seen today for ongoing prenatal care.  She is currently monitored for the following issues for this low-risk pregnancy and has Supervision of normal first pregnancy, antepartum; History of elective breast augmentation; Anemia affecting pregnancy in third trimester; and Allergic rhinitis on their problem list.  Patient reports no complaints.  Contractions: Not present. Vag. Bleeding: None.  Movement: Present. Denies leaking of fluid.   The following portions of the patient's history were reviewed and updated as appropriate: allergies, current medications, past family history, past medical history, past social history, past surgical history and problem list.   Objective:   Vitals:   11/01/21 0856  BP: 103/69  Pulse: 93  Weight: 147 lb 12.8 oz (67 kg)    Fetal Status: Fetal Heart Rate (bpm): 134 Fundal Height: 33 cm Movement: Present     General:  Alert, oriented and cooperative. Patient is in no acute distress.  Skin: Skin is warm and dry. No rash noted.   Cardiovascular: Normal heart rate noted  Respiratory: Normal respiratory effort, no problems with respiration noted  Abdomen: Soft, gravid, appropriate for gestational age.  Pain/Pressure: Absent     Pelvic: Cervical exam deferred        Extremities: Normal range of motion.  Edema: None  Mental Status: Normal mood and affect. Normal behavior. Normal judgment and thought content.   Assessment and Plan:  Pregnancy: G1P0 at [redacted]w[redacted]d 1. Supervision of normal first pregnancy, antepartum Continue routine prenatal care.   Preterm labor symptoms and general obstetric precautions including but not limited to vaginal bleeding, contractions, leaking of fluid and fetal movement were reviewed in detail with the patient. Please refer to After Visit Summary for other counseling recommendations.   Return in 2 weeks (on 11/15/2021).  Future Appointments  Date  Time Provider Department Center  11/16/2021  3:15 PM Federico Flake, MD [facility]  11/25/2021  9:15 AM [facility] NURSE [facility]  11/25/2021  9:30 AM [facility] US3 [facility]  11/29/2021  9:35 AM Federico Flake, MD [facility]  12/06/2021 10:35 AM Federico Flake, MD [facility]  12/23/2021  3:55 PM Federico Flake, MD [facility]  12/27/2021  9:15 AM [facility] NST [facility]  12/27/2021 10:55 AM Alvester Morin, Isa Rankin, MD [facility]    Reva Bores, MD

## 2021-11-16 DIAGNOSIS — Z3400 Encounter for supervision of normal first pregnancy, unspecified trimester: Secondary | ICD-10-CM

## 2021-11-16 DIAGNOSIS — O99013 Anemia complicating pregnancy, third trimester: Secondary | ICD-10-CM

## 2021-11-16 DIAGNOSIS — Z9882 Breast implant status: Secondary | ICD-10-CM

## 2021-11-16 NOTE — Progress Notes (Signed)
   PRENATAL VISIT NOTE  Subjective:  Leslie Fox is a 26 y.o. G1P0 at [redacted]w[redacted]d being seen today for ongoing prenatal care.  She is currently monitored for the following issues for this low-risk pregnancy and has Supervision of normal first pregnancy, antepartum; History of elective breast augmentation; Anemia affecting pregnancy in third trimester; and Allergic rhinitis on their problem list.  Patient reports backache.  Contractions: Irritability. Vag. Bleeding: None.  Movement: Present. Denies leaking of fluid.   The following portions of the patient's history were reviewed and updated as appropriate: allergies, current medications, past family history, past medical history, past social history, past surgical history and problem list.   Objective:   Vitals:   11/16/21 1540  BP: 106/75  Pulse: (!) 111  Weight: 153 lb (69.4 kg)    Fetal Status: Fetal Heart Rate (bpm): 130   Movement: Present     General:  Alert, oriented and cooperative. Patient is in no acute distress.  Skin: Skin is warm and dry. No rash noted.   Cardiovascular: Normal heart rate noted  Respiratory: Normal respiratory effort, no problems with respiration noted  Abdomen: Soft, gravid, appropriate for gestational age.  Pain/Pressure: Absent     Pelvic: Cervical exam deferred        Extremities: Normal range of motion.  Edema: Trace  Mental Status: Normal mood and affect. Normal behavior. Normal judgment and thought content.   Assessment and Plan:  Pregnancy: G1P0 at 100w5d 1. Supervision of normal first pregnancy, antepartum Up to date Having back pain-- Given info about back pain alleviation techniques in AVS Reviewed s/sx of labor Discussed when to go to the hospital Has [place] to check fetal growth  2. History of elective breast augmentation Might effect breastfeeding Recommend close follow up pp  3. Anemia affecting pregnancy in third trimester HGB 9.9    Preterm labor symptoms and general obstetric  precautions including but not limited to vaginal bleeding, contractions, leaking of fluid and fetal movement were reviewed in detail with the patient. Please refer to After Visit Summary for other counseling recommendations.   Return in about 2 weeks (around 11/30/2021) for Routine prenatal care, Mom+Baby Combined Care.  Future Appointments  Date Time Provider Department Center  11/25/2021  9:15 AM [facility] NURSE [facility]  11/25/2021  9:30 AM [facility] US3 [facility]  11/29/2021  9:35 AM Federico Flake, MD [facility]  12/06/2021 10:35 AM Federico Flake, MD [facility]  12/23/2021  3:55 PM Federico Flake, MD [facility]  12/27/2021  9:15 AM [facility] NST [facility]  12/27/2021 10:55 AM Alvester Morin, Isa Rankin, MD [facility]    Federico Flake, MD

## 2021-11-16 NOTE — Patient Instructions (Signed)
Back pain is very common in pregnancy. There are several things that can help with the back pain.   You can safely use Tylenol for pain if needed   Other options are:  - Pregnancy Yoga - There are free options through YouTube and you can purchase DVDs easily.  - Float therapy-- This involves soaking in a warm bath of magnesium to help relax muscles. Local places that have this option are [facility] in [place] or [facility] in [facility] - Massage therapy- This is safe in pregnancy. Just assure your therapist is trained in prenatal massage. Some local options include [employer] and [facility] - Chiropractic care-- This involves re-aligning the bones and muscles of the body. You want to make sure the practitioner has training in pregnancy ([employer] Method).  A local option is [facility] - at [employer] and [employer]. [phone] [email or web address] and there are many other chiropractic offices that do adjustments in pregnancy

## 2021-11-16 NOTE — Progress Notes (Signed)
Pt reports back pain & has maternity belt but it does'nt really help.Pain in lower abdomen especially at night.

## 2021-11-25 DIAGNOSIS — Z3689 Encounter for other specified antenatal screening: Secondary | ICD-10-CM | POA: Insufficient documentation

## 2021-11-25 DIAGNOSIS — O358XX0 Maternal care for other (suspected) fetal abnormality and damage, not applicable or unspecified: Secondary | ICD-10-CM

## 2021-11-25 DIAGNOSIS — Z3A36 36 weeks gestation of pregnancy: Secondary | ICD-10-CM

## 2021-11-29 DIAGNOSIS — O3663X0 Maternal care for excessive fetal growth, third trimester, not applicable or unspecified: Secondary | ICD-10-CM

## 2021-11-29 DIAGNOSIS — Z3A36 36 weeks gestation of pregnancy: Secondary | ICD-10-CM | POA: Insufficient documentation

## 2021-11-29 DIAGNOSIS — Z3400 Encounter for supervision of normal first pregnancy, unspecified trimester: Secondary | ICD-10-CM

## 2021-11-29 DIAGNOSIS — O99013 Anemia complicating pregnancy, third trimester: Secondary | ICD-10-CM

## 2021-11-29 DIAGNOSIS — Z9882 Breast implant status: Secondary | ICD-10-CM

## 2021-11-29 DIAGNOSIS — O3660X0 Maternal care for excessive fetal growth, unspecified trimester, not applicable or unspecified: Secondary | ICD-10-CM | POA: Insufficient documentation

## 2021-11-29 NOTE — Progress Notes (Signed)
   PRENATAL VISIT NOTE  Subjective:  Leslie Fox is a 26 y.o. G1P0 at [redacted]w[redacted]d being seen today for ongoing prenatal care.  She is currently monitored for the following issues for this low-risk pregnancy and has Supervision of normal first pregnancy, antepartum; History of elective breast augmentation; Anemia affecting pregnancy in third trimester; Allergic rhinitis; and Suspected LGA on their problem list.  Patient reports  had one strong ctx. Worried about her [place] that showed a larger baby .  Contractions: Irritability. Vag. Bleeding: None.  Movement: Present. Denies leaking of fluid.   The following portions of the patient's history were reviewed and updated as appropriate: allergies, current medications, past family history, past medical history, past social history, past surgical history and problem list.   Objective:   Vitals:   11/29/21 1024  BP: 111/75  Pulse: 86  Weight: 156 lb 9.6 oz (71 kg)    Fetal Status: Fetal Heart Rate (bpm): 136 Fundal Height: 37 cm Movement: Present  Presentation: Vertex  General:  Alert, oriented and cooperative. Patient is in no acute distress.  Skin: Skin is warm and dry. No rash noted.   Cardiovascular: Normal heart rate noted  Respiratory: Normal respiratory effort, no problems with respiration noted  Abdomen: Soft, gravid, appropriate for gestational age.  Pain/Pressure: Present     Pelvic: Cervical exam performed in the presence of a chaperone Dilation: Closed Effacement (%): Thick Station: -3  Extremities: Normal range of motion.  Edema: Trace  Mental Status: Normal mood and affect. Normal behavior. Normal judgment and thought content.   Assessment and Plan:  Pregnancy: G1P0 at [redacted]w[redacted]d 1. Supervision of normal first pregnancy, antepartum Reviewed [place] results and discussed that these can be off by 1 lb at this point. Reviewed that she does not have a reason to grow a baby that will not fit out of her pelvis (like GDM). Discussed elective IOL at  39 vs 40 weeks and comfort of team with 41 wk IOL in general given low risk pregnancy.  Patient will think about elective IOL Recommended FB insertion outpatient for IOL to help decrease time in hospital safely - GC/Chlamydia probe amp ([place])not at [facility] - Culture, beta strep (group b only)  2. History of elective breast augmentation  3. Anemia affecting pregnancy in third trimester Left before CBC drawn  4. [redacted] weeks gestation of pregnancy - GC/Chlamydia probe amp ([place])not at [facility] - Culture, beta strep (group b only)  5. Excessive fetal growth affecting management of pregnancy in third trimester, single or unspecified fetus >99th% at 35 weeks Discussed that we are not afraid of bigger infants and indication for CS is based on her labor not the size of baby.   Preterm labor symptoms and general obstetric precautions including but not limited to vaginal bleeding, contractions, leaking of fluid and fetal movement were reviewed in detail with the patient. Please refer to After Visit Summary for other counseling recommendations.   Return in about 1 week (around 12/06/2021) for Mom+Baby Combined Care.  Future Appointments  Date Time Provider Department Center  12/06/2021 10:35 AM Federico Flake, MD [facility]  12/23/2021  3:55 PM Federico Flake, MD [facility]  12/27/2021  9:15 AM [facility] NST [facility]  12/27/2021 10:55 AM Alvester Morin, Isa Rankin, MD [facility]    Federico Flake, MD

## 2023-06-14 IMAGING — US US MFM OB FOLLOW-UP
1 series · 16 of 28 positions shown · non-contrast
Comparison: none

[Series 1: us mfm ob follow-up · 98 acquisitions, 16 frames shown]
[im 1/98]
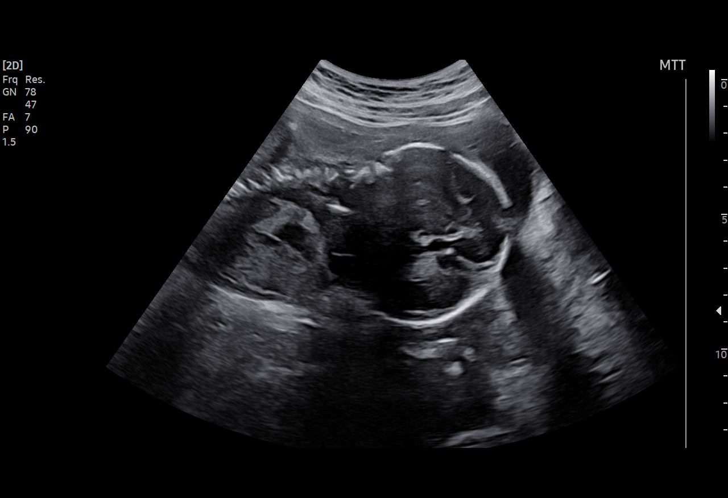
[im 8/98]
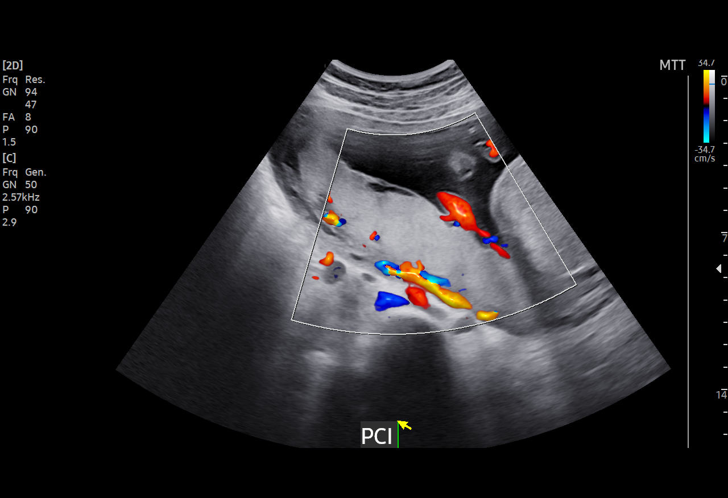
[im 15/98]
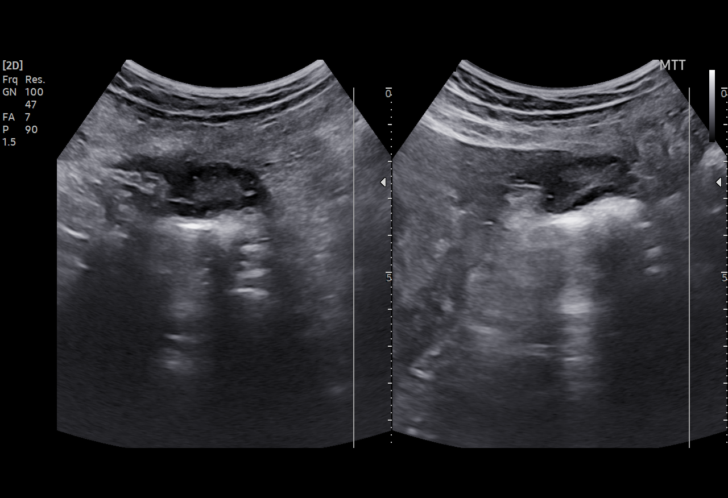
[im 22/98]
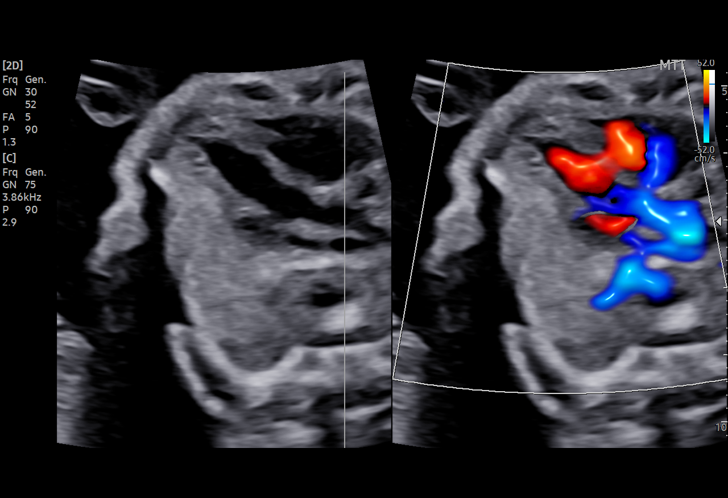
[im 26/98]
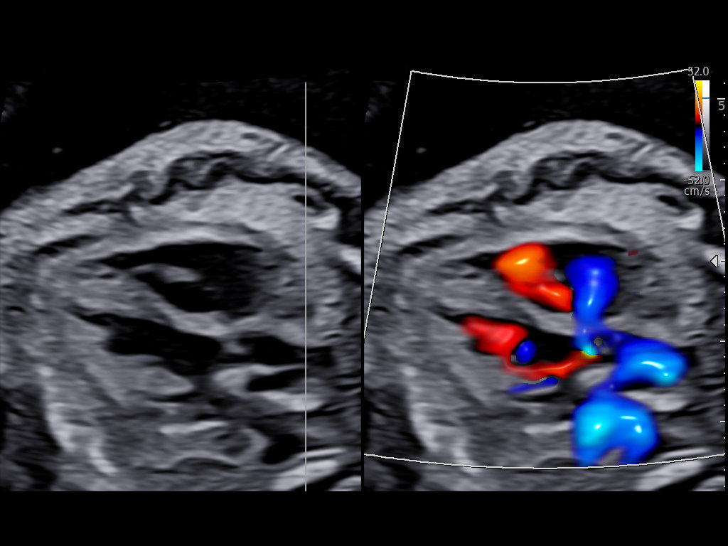
[im 33/98]
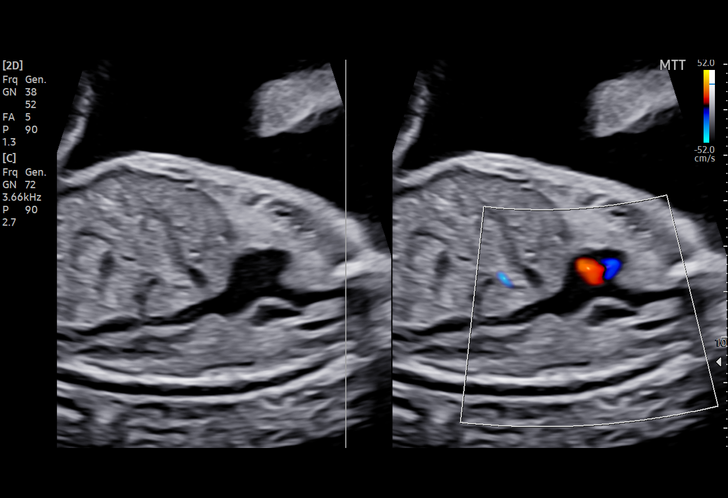
[im 40/98]
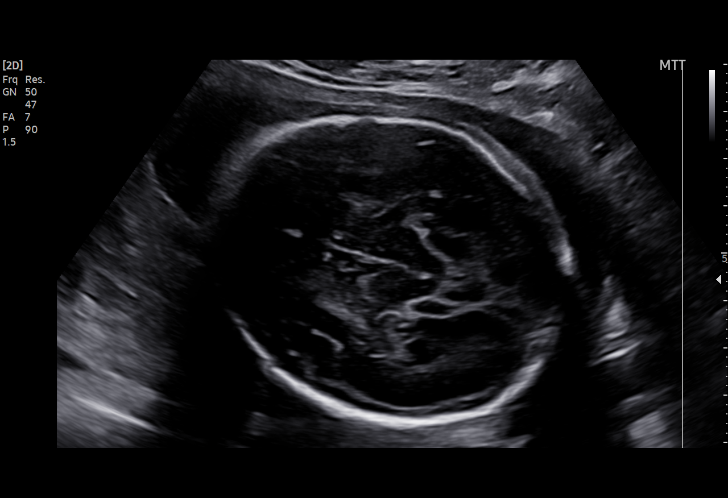
[im 47/98]
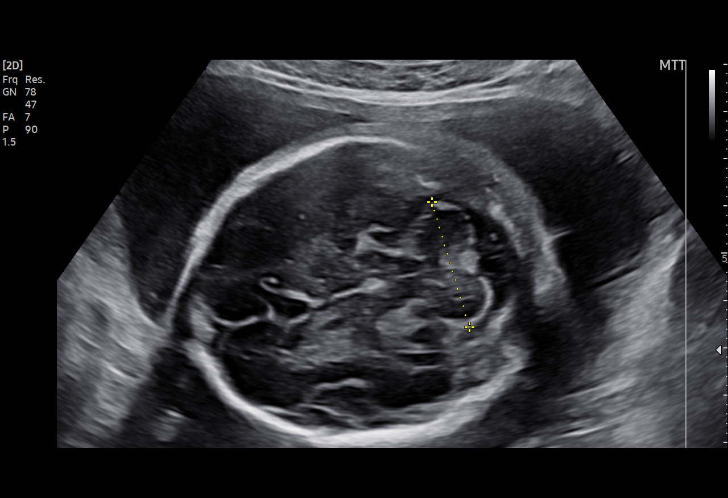
[im 51/98]
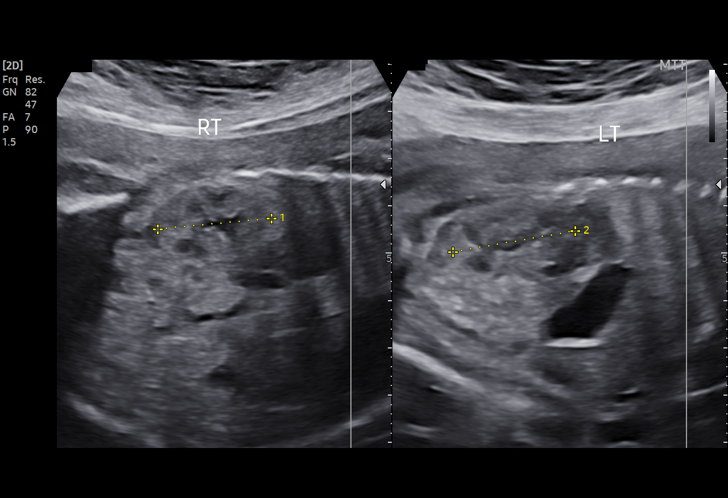
[im 58/98]
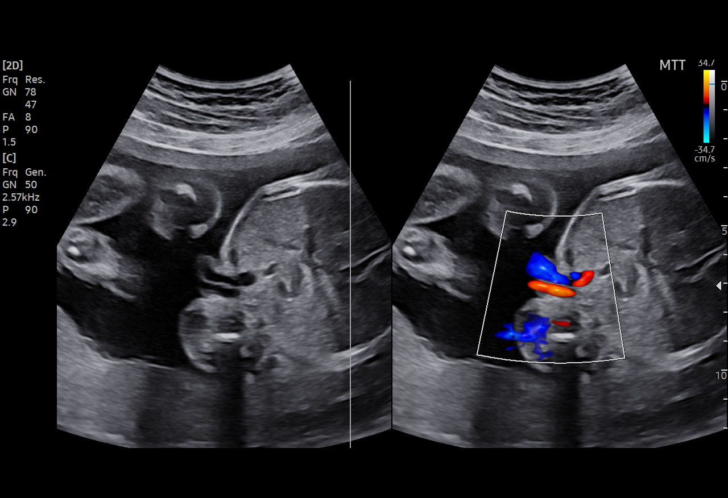
[im 65/98]
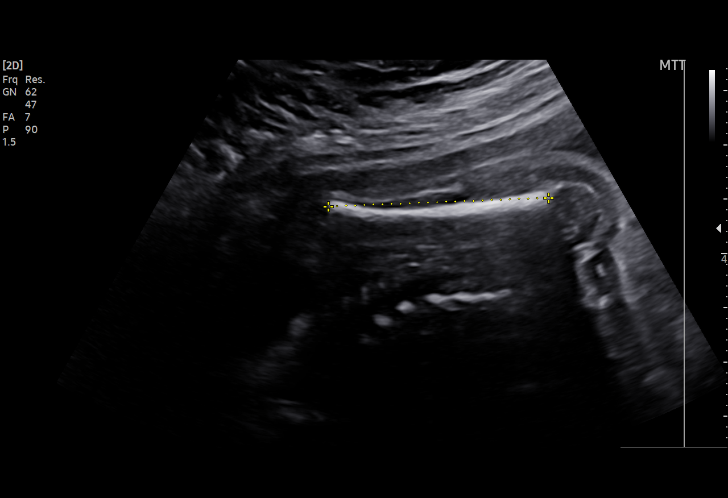
[im 72/98]
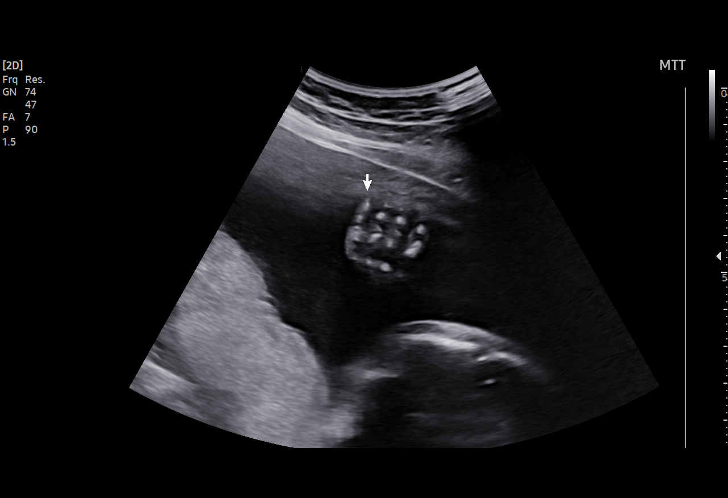
[im 76/98]
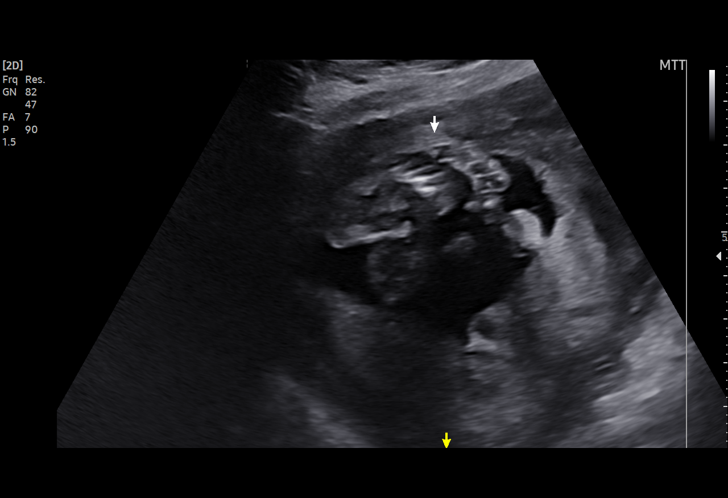
[im 83/98]
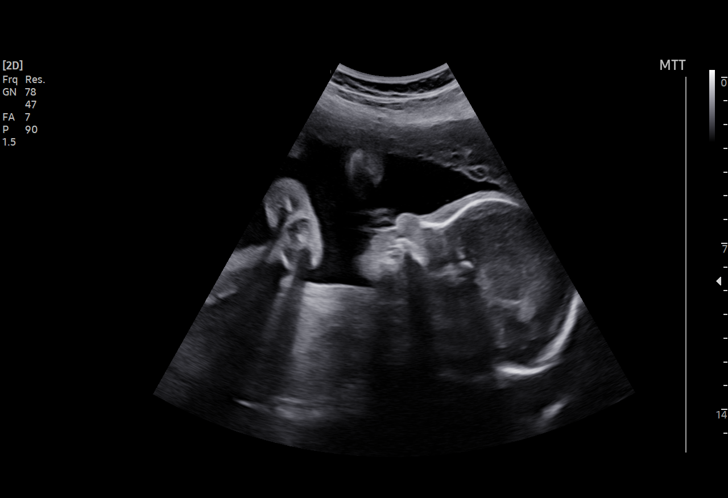
[im 90/98]
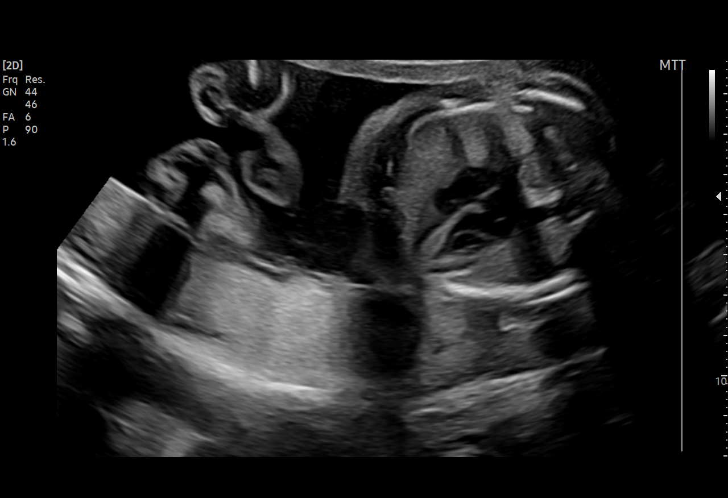
[im 98/98]
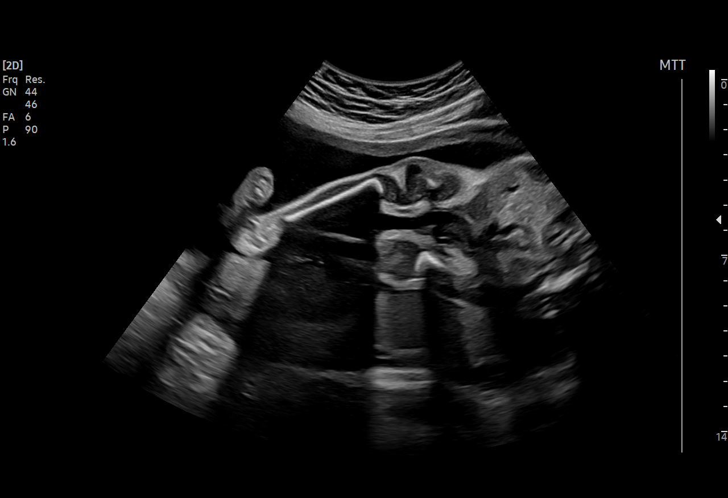

[16 of 28 positions shown; findings below may reference images not displayed]

[id number]

Indications

 Fetal abnormality - other known or suspected
 24 weeks gestation of pregnancy
 Low Risk NIPS
